# Patient Record
Sex: Male | Born: 1980 | Race: Black or African American | Hispanic: No | Marital: Single | Smoking: Current every day smoker
Health system: Southern US, Community
[De-identification: ages and names within clinical notes are randomized; demographics above are authoritative.]

---

## 2018-05-23 ENCOUNTER — Encounter (HOSPITAL_COMMUNITY): Payer: Self-pay

## 2018-05-23 ENCOUNTER — Emergency Department (HOSPITAL_COMMUNITY)
Admission: EM | Admit: 2018-05-23 | Discharge: 2018-05-23 | Disposition: A | Discharge status: Home / Self Care | Payer: Self-pay | Attending: Emergency Medicine | Admitting: Emergency Medicine

## 2018-05-23 ENCOUNTER — Other Ambulatory Visit: Payer: Self-pay

## 2018-05-23 DIAGNOSIS — R202 Paresthesia of skin: Secondary | ICD-10-CM | POA: Insufficient documentation

## 2018-05-23 DIAGNOSIS — M5410 Radiculopathy, site unspecified: Secondary | ICD-10-CM | POA: Insufficient documentation

## 2018-05-23 DIAGNOSIS — F17210 Nicotine dependence, cigarettes, uncomplicated: Secondary | ICD-10-CM | POA: Insufficient documentation

## 2018-05-23 MED ORDER — PREDNISONE 10 MG (21) PO TBPK: ORAL_TABLET | Freq: Every day | ORAL | 0 refills | Status: DC

## 2018-05-23 MED ORDER — METHOCARBAMOL 500 MG PO TABS: 500.0000 mg | ORAL_TABLET | Freq: Two times a day (BID) | ORAL | 0 refills | Status: DC

## 2018-05-23 MED ORDER — OXYCODONE-ACETAMINOPHEN 5-325 MG PO TABS
1.0000 | ORAL_TABLET | Freq: Once | ORAL | Status: AC
  Administered 2018-05-23: 1 via ORAL
  Filled 2018-05-23: qty 1

## 2018-05-23 NOTE — ED Provider Notes (Signed)
[facility]-EMERGENCY DEPT Provider Note   CSN: [id number] Arrival date & time: 05/23/18  1106     History   Chief Complaint Chief Complaint  Patient presents with  . Arm Pain    HPI Wayne Figueroa is a 37 y.o. male presents to ED for evaluation of 38-month history of right upper extremity sharp shooting pain that originates in wrist and radiates up his arm.  Reports associated paresthesias.  States that symptoms initially began 6 months ago when he was lifting heavy boxes about 75 200 pounds each for work.  He has since quit that job.  He has been taking NSAIDs with no improvement in his symptoms.  He denies any direct injuries or falls.  No prior fracture, dislocations or procedures in the area.  States that pain is worse with movement.  Denies history of gout, joint pains, fever, numbness in arms or legs, outside work in the heat, history of DVT, recent surgeries, recent prolonged travel or chest pain.  HPI  History reviewed. No pertinent past medical history.  There are no active problems to display for this patient.   History reviewed. No pertinent surgical history.      Home Medications    Prior to Admission medications   Medication Sig Start Date End Date Taking? Authorizing Provider  methocarbamol (ROBAXIN) 500 MG tablet Take 1 tablet (500 mg total) by mouth 2 (two) times daily. 05/23/18   Hyman Crossan, PA-C  predniSONE (STERAPRED UNI-PAK 21 TAB) 10 MG (21) TBPK tablet Take by mouth daily. Take 6 tabs by mouth daily  for 2 days, then 5 tabs for 2 days, then 4 tabs for 2 days, then 3 tabs for 2 days, 2 tabs for 2 days, then 1 tab by mouth daily for 2 days 05/23/18   Dietrich Pates, PA-C    Family History No family history on file.  Social History Social History   Tobacco Use  . Smoking status: Current Every Day Smoker    Packs/day: 0.50    Types: Cigarettes  . Smokeless tobacco: Never Used  Substance Use Topics  . Alcohol use: Yes   Comment: socially  . Drug use: Never     Allergies   Patient has no known allergies.   Review of Systems Review of Systems  Constitutional: Negative for chills and fever.  Cardiovascular: Negative for chest pain.  Musculoskeletal: Positive for myalgias.  Skin: Negative for wound.  Neurological: Negative for dizziness and numbness.     Physical Exam Updated Vital Signs BP (!) 148/84 (BP Location: Left Arm)   Pulse 74   Temp 97.9 F (36.6 C) (Oral)   Resp 16   Ht 6\' 2"  (1.88 m)   Wt 73 kg   SpO2 100%   BMI 20.67 kg/m   Physical Exam  Constitutional: He appears well-developed and well-nourished. No distress.  HENT:  Head: Normocephalic and atraumatic.  Eyes: Conjunctivae and EOM are normal. No scleral icterus.  Neck: Normal range of motion.  Cardiovascular: Normal rate, regular rhythm and normal heart sounds.  Pulmonary/Chest: Effort normal and breath sounds normal. No respiratory distress.  Musculoskeletal: Normal range of motion. He exhibits tenderness. He exhibits no edema or deformity.  Positive Phalen's test.  No joint swelling noted of bilateral upper extremities.  Full active and passive range of motion of wrist, elbow and shoulder of the right upper extremity.  2+ radial pulse palpated bilaterally.  No palpable cords, edema of arm noted.  No warmth of joint  noted.  Neurological: He is alert.  Skin: No rash noted. He is not diaphoretic.  Psychiatric: He has a normal mood and affect.  Nursing note and vitals reviewed.    ED Treatments / Results  Labs (all labs ordered are listed, but only abnormal results are displayed) Labs Reviewed - No data to display  EKG None  Radiology No results found.  Procedures Procedures (including critical care time)  Medications Ordered in ED Medications  oxyCODONE-acetaminophen (PERCOCET/ROXICET) 5-325 MG per tablet 1 tablet (has no administration in time range)     Initial Impression / Assessment and Plan / ED  Course  I have reviewed the triage vital signs and the nursing notes.  Pertinent labs & imaging results that were available during my care of the patient were reviewed by me and considered in my medical decision making (see chart for details).     37 year old male presents to ED for evaluation of 1457-month history of gradually worsening right arm pain.  Describes the pain as sharp, shooting and associated with paresthesias.  Pain began after he was lifting heavy boxes for work.  He has since quit this job.  No history of DVT.  On exam there is evidence of radiculopathy noted.  No objective findings that would concern me for DVT or infectious cause of symptoms.  Full active and passive range of motion of extremity without difficulty.  Area is neurovascularly intact.  No imaging warranted at this time as he denies any injury.  Will treat for possible radiculopathy.  Will give muscle relaxer, steroids, orthopedic follow-up.  Will advise him to return to ED for any severe worsening symptoms.  Portions of this note were generated with Scientist, clinical (histocompatibility and immunogenetics)Dragon dictation software. Dictation errors may occur despite best attempts at proofreading.   Final Clinical Impressions(s) / ED Diagnoses   Final diagnoses:  Radiculopathy, unspecified spinal region    ED Discharge Orders         Ordered    predniSONE (STERAPRED UNI-PAK 21 TAB) 10 MG (21) TBPK tablet  Daily     05/23/18 1248    methocarbamol (ROBAXIN) 500 MG tablet  2 times daily     05/23/18 1248           Dietrich PatesKhatri, Jhan Conery, PA-C 05/23/18 1252    Little, Ambrose Finlandachel Morgan, MD 05/23/18 [phone]

## 2018-05-23 NOTE — ED Triage Notes (Signed)
Pt states that he has been having right arm pain for 7-8 months. Pt states pain initially started when he was moving heavy boxes. Pt states that he came in today because the pain has continued to get worse.

## 2018-05-23 NOTE — Discharge Instructions (Signed)
Return to ED for worsening symptoms, loss of sensation in your arms or legs, injuries or falls, swelling of your joints, chest pain.

## 2018-05-23 NOTE — ED Notes (Signed)
Patient out of ED with steady gait in no distress.

## 2018-05-30 ENCOUNTER — Emergency Department (HOSPITAL_COMMUNITY)
Admission: EM | Admit: 2018-05-30 | Discharge: 2018-05-30 | Disposition: A | Discharge status: Home / Self Care | Payer: Self-pay | Attending: Emergency Medicine | Admitting: Emergency Medicine

## 2018-05-30 ENCOUNTER — Encounter (HOSPITAL_COMMUNITY): Payer: Self-pay | Admitting: Emergency Medicine

## 2018-05-30 DIAGNOSIS — R51 Headache: Secondary | ICD-10-CM | POA: Insufficient documentation

## 2018-05-30 DIAGNOSIS — K0889 Other specified disorders of teeth and supporting structures: Secondary | ICD-10-CM | POA: Insufficient documentation

## 2018-05-30 DIAGNOSIS — F17210 Nicotine dependence, cigarettes, uncomplicated: Secondary | ICD-10-CM | POA: Insufficient documentation

## 2018-05-30 MED ORDER — IBUPROFEN 200 MG PO TABS
600.0000 mg | ORAL_TABLET | Freq: Once | ORAL | Status: AC
  Administered 2018-05-30: 600 mg via ORAL
  Filled 2018-05-30: qty 3

## 2018-05-30 MED ORDER — PENICILLIN V POTASSIUM 500 MG PO TABS: 500.0000 mg | ORAL_TABLET | Freq: Four times a day (QID) | ORAL | 0 refills | Status: AC

## 2018-05-30 MED ORDER — PENICILLIN V POTASSIUM 500 MG PO TABS
500.0000 mg | ORAL_TABLET | Freq: Once | ORAL | Status: AC
  Administered 2018-05-30: 500 mg via ORAL
  Filled 2018-05-30: qty 1

## 2018-05-30 NOTE — Discharge Instructions (Signed)
Please take all of your antibiotics until finished!   You may develop abdominal discomfort or diarrhea from the antibiotic.  You may help offset this with probiotics which you can buy or get in yogurt. Do not eat  or take the probiotics until 2 hours after your antibiotic.   Apply warm compresses to jaw throughout the day. Alternate 600 mg of ibuprofen and [phone] mg of Tylenol every 3 hours as needed for pain. Do not exceed 4000 mg of Tylenol daily.  You may also use warm water salt gargles, Orajel, or other over-the-counter dental pain remedies. Followup with a dentist is very important for ongoing evaluation and management of recurrent dental pain.  I have attached resources for follow-up with a dentist in the area.  You can also wait until your dental insurance kicks in and you can call the number on the back of your insurance card to help find a dentist.  Return to emergency department for emergent changing or worsening symptoms such as fever, worsening facial swelling, difficulty breathing or swallowing, throat tightness, or vision changes.

## 2018-05-30 NOTE — ED Triage Notes (Signed)
Patient here from home with complaints of right lower abscess in mouth. Dental pain x2 weeks. OTC meds with no relief.

## 2018-05-30 NOTE — ED Provider Notes (Signed)
[facility]-EMERGENCY DEPT Provider Note   CSN: [id number] Arrival date & time: 05/30/18  4098     History   Chief Complaint Chief Complaint  Patient presents with  . Dental Pain  . Abscess    HPI Wayne Figueroa is a 37 y.o. male presents today for evaluation of acute onset, progressively worsening right mandibular dental pain for 2 weeks.  He has noted a dull throbbing to this area intermittently for the past 2 weeks which acutely worsened 2 days ago and has been constant.  He notes radiation to the right side of the face and skull.  Notes headaches are mild and throbbing, similar to headaches he has had in the past.  Denies difficulty breathing or swallowing, no fevers.  Has tried warm tea without relief of his symptoms. Mild facial swelling began today, no drainage. Recently relocated to this area and does not have a dentist.   The history is provided by the patient.    History reviewed. No pertinent past medical history.  There are no active problems to display for this patient.   History reviewed. No pertinent surgical history.      Home Medications    Prior to Admission medications   Medication Sig Start Date End Date Taking? Authorizing Provider  ibuprofen (ADVIL,MOTRIN) 200 MG tablet Take 400 mg by mouth every 6 (six) hours as needed for mild pain.   Yes [provider]  methocarbamol (ROBAXIN) 500 MG tablet Take 1 tablet (500 mg total) by mouth 2 (two) times daily. Patient not taking: Reported on 05/30/2018 05/23/18   Dietrich Pates, PA-C  penicillin v potassium (VEETID) 500 MG tablet Take 1 tablet (500 mg total) by mouth 4 (four) times daily for 7 days. 05/30/18 06/06/18  Michela Pitcher A, PA-C  predniSONE (STERAPRED UNI-PAK 21 TAB) 10 MG (21) TBPK tablet Take by mouth daily. Take 6 tabs by mouth daily  for 2 days, then 5 tabs for 2 days, then 4 tabs for 2 days, then 3 tabs for 2 days, 2 tabs for 2 days, then 1 tab by mouth daily for 2  days Patient not taking: Reported on 05/30/2018 05/23/18   Dietrich Pates, PA-C    Family History No family history on file.  Social History Social History   Tobacco Use  . Smoking status: Current Every Day Smoker    Packs/day: 0.50    Types: Cigarettes  . Smokeless tobacco: Never Used  Substance Use Topics  . Alcohol use: Yes    Comment: socially  . Drug use: Never     Allergies   Patient has no known allergies.   Review of Systems Review of Systems  Constitutional: Negative for chills and fever.  HENT: Positive for dental problem and facial swelling. Negative for trouble swallowing.   Eyes: Negative for photophobia and visual disturbance.  Respiratory: Negative for shortness of breath.   Gastrointestinal: Negative for nausea and vomiting.  Neurological: Positive for headaches. Negative for dizziness, syncope, speech difficulty, weakness, light-headedness and numbness.  All other systems reviewed and are negative.    Physical Exam Updated Vital Signs BP 136/86 (BP Location: Right Arm)   Pulse 72   Temp 98.6 F (37 C) (Oral)   Resp 20   SpO2 100%   Physical Exam  Constitutional: He is oriented to person, place, and time. He appears well-developed and well-nourished. No distress.  HENT:  Head: Normocephalic and atraumatic.  Generally decaying dentition with right second premolar with large cavity  and exposed dentin.  No tenderness to percussion.  Very mild area of swelling to the right mandibular region with no fluctuance or drainage. Dentition appears to be stable. No trismus. Mouth opening to at least 3 finger widths. Handles oral secretions without difficulty. No noted facial swelling otherwise. No swelling or tenderness to the submental or submandibular regions. No swelling or tenderness into the soft tissues of the neck.   Eyes: Pupils are equal, round, and reactive to light. Conjunctivae and EOM are normal. Right eye exhibits no discharge. Left eye exhibits  no discharge.  Neck: Normal range of motion. Neck supple. No JVD present. No tracheal deviation present.  Cardiovascular: Normal rate.  Pulmonary/Chest: Effort normal.  Abdominal: He exhibits no distension.  Musculoskeletal: He exhibits no edema.  Neurological: He is alert and oriented to person, place, and time. No cranial nerve deficit or sensory deficit. He exhibits normal muscle tone.  Skin: Skin is warm and dry. No erythema.  Psychiatric: He has a normal mood and affect. His behavior is normal.  Nursing note and vitals reviewed.    ED Treatments / Results  Labs (all labs ordered are listed, but only abnormal results are displayed) Labs Reviewed - No data to display  EKG None  Radiology No results found.  Procedures Procedures (including critical care time)  Medications Ordered in ED Medications  penicillin v potassium (VEETID) tablet 500 mg (has no administration in time range)  ibuprofen (ADVIL,MOTRIN) tablet 600 mg (has no administration in time range)     Initial Impression / Assessment and Plan / ED Course  I have reviewed the triage vital signs and the nursing notes.  Pertinent labs & imaging results that were available during my care of the patient were reviewed by me and considered in my medical decision making (see chart for details).     Patient with toothache.  No gross abscess amenable to I&D.  Patient is afebrile, vital signs are stable.  He is nontoxic in appearance.  Tolerating secretions without difficulty.  No focal neurologic deficits.  History of head trauma.  Exam unconcerning for Ludwig's angina or spread of infection.  Will treat with penicillin and NSAIDs.  Urged patient to follow-up with dentist, and he states he will be obtaining dental insurance shortly.  Discussed strict ED return precautions. Pt verbalized understanding of and agreement with plan and is safe for discharge home at this time.    Final Clinical Impressions(s) / ED Diagnoses    Final diagnoses:  Pain, dental    ED Discharge Orders         Ordered    penicillin v potassium (VEETID) 500 MG tablet  4 times daily     05/30/18 1228           Jeanie Sewer, PA-C 05/30/18 1230    Alvira Monday, MD 05/30/18 2322

## 2018-09-18 ENCOUNTER — Encounter (HOSPITAL_COMMUNITY): Payer: Self-pay | Admitting: *Deleted

## 2018-09-18 ENCOUNTER — Other Ambulatory Visit: Payer: Self-pay

## 2018-09-18 DIAGNOSIS — K047 Periapical abscess without sinus: Secondary | ICD-10-CM | POA: Insufficient documentation

## 2018-09-18 DIAGNOSIS — K029 Dental caries, unspecified: Secondary | ICD-10-CM | POA: Insufficient documentation

## 2018-09-18 DIAGNOSIS — F17210 Nicotine dependence, cigarettes, uncomplicated: Secondary | ICD-10-CM | POA: Insufficient documentation

## 2018-09-18 NOTE — ED Triage Notes (Signed)
Pt presents with a "knot" on the right side of pt's jaw.  Pt stated that a few month ago, he had an abscess in that area on his gum. Pt reports he's had the knot for the past month. Pt denies any fevers or chills.

## 2018-09-19 ENCOUNTER — Emergency Department (HOSPITAL_COMMUNITY)
Admission: EM | Admit: 2018-09-19 | Discharge: 2018-09-19 | Disposition: A | Discharge status: Home / Self Care | Payer: Self-pay | Attending: Emergency Medicine | Admitting: Emergency Medicine

## 2018-09-19 DIAGNOSIS — K029 Dental caries, unspecified: Secondary | ICD-10-CM

## 2018-09-19 DIAGNOSIS — K047 Periapical abscess without sinus: Secondary | ICD-10-CM

## 2018-09-19 MED ORDER — IBUPROFEN 800 MG PO TABS
800.0000 mg | ORAL_TABLET | Freq: Once | ORAL | Status: AC
  Administered 2018-09-19: 800 mg via ORAL
  Filled 2018-09-19: qty 1

## 2018-09-19 MED ORDER — CLINDAMYCIN HCL 150 MG PO CAPS: 300.0000 mg | ORAL_CAPSULE | Freq: Four times a day (QID) | ORAL | 0 refills | Status: AC

## 2018-09-19 MED ORDER — CLINDAMYCIN HCL 300 MG PO CAPS
300.0000 mg | ORAL_CAPSULE | Freq: Once | ORAL | Status: AC
  Administered 2018-09-19: 300 mg via ORAL
  Filled 2018-09-19: qty 1

## 2018-09-19 MED ORDER — ACETAMINOPHEN 500 MG PO TABS
1000.0000 mg | ORAL_TABLET | Freq: Once | ORAL | Status: AC
  Administered 2018-09-19: 1000 mg via ORAL
  Filled 2018-09-19: qty 2

## 2018-09-19 NOTE — Discharge Instructions (Addendum)
Use ice pack on your face over the swollen area.  Take ibuprofen 600 mg plus acetaminophen 1000 mg 4 times a day for pain.  I gave you a prescription card for good Rx for your antibiotic, [facility] it to [facility] and it should be around $12.  Please try to find a dentist to do definitive treatment for your teeth, if you want the teeth pulled sometimes the denture services offices will pull it for a cheaper rate than a dentist.  Return to the emergency department if he having difficulty swallowing or breathing or the swelling spreads to the upper portion of your face.

## 2018-09-19 NOTE — ED Provider Notes (Signed)
[facility]-EMERGENCY DEPT Provider Note   CSN: [id number] Arrival date & time: 09/18/18  2143  Time seen 2:45 AM   History   Chief Complaint Chief Complaint  Patient presents with  . Abscess    right jaw    HPI Wayne Figueroa is a 38 y.o. male.  HPI patient states he was seen about 3 months ago for a dental abscess and was treated with penicillin.  When I review his prior ED visits in our system it was in September 2019.  He states for the past month he has been getting increasing swelling.  He denies any fever.  He states when he sleeps it hurts on the right side of his face.  He is only noticed sugar content of food makes the pain worse, nothing makes it feel better, he denies any hot or cold sensitivity.  He denies having a dentist.  PCP Patient, No Pcp Per   History reviewed. No pertinent past medical history.  There are no active problems to display for this patient.   History reviewed. No pertinent surgical history.      Home Medications    None  Prior to Admission medications   Medication Sig Start Date End Date Taking? Authorizing Provider  clindamycin (CLEOCIN) 150 MG capsule Take 2 capsules (300 mg total) by mouth 4 (four) times daily for 10 days. 09/19/18 09/29/18  Devoria Albe, MD  methocarbamol (ROBAXIN) 500 MG tablet Take 1 tablet (500 mg total) by mouth 2 (two) times daily. Patient not taking: Reported on 05/30/2018 05/23/18   Dietrich Pates, PA-C  predniSONE (STERAPRED UNI-PAK 21 TAB) 10 MG (21) TBPK tablet Take by mouth daily. Take 6 tabs by mouth daily  for 2 days, then 5 tabs for 2 days, then 4 tabs for 2 days, then 3 tabs for 2 days, 2 tabs for 2 days, then 1 tab by mouth daily for 2 days Patient not taking: Reported on 05/30/2018 05/23/18   Dietrich Pates, PA-C    Family History No family history on file.  Social History Social History   Tobacco Use  . Smoking status: Current Every Day Smoker    Packs/day: 0.33    Types:  Cigarettes  . Smokeless tobacco: Never Used  Substance Use Topics  . Alcohol use: Yes    Comment: socially  . Drug use: Never     Allergies   Patient has no known allergies.   Review of Systems Review of Systems  All other systems reviewed and are negative.    Physical Exam Updated Vital Signs BP (!) 153/87 (BP Location: Left Arm)   Pulse 95   Temp 98.9 F (37.2 C) (Oral)   Resp 17   Ht 6\' 2"  (1.88 m)   Wt 76.7 kg   SpO2 100%   BMI 21.70 kg/m   Physical Exam Vitals signs and nursing note reviewed.  Constitutional:      Appearance: Normal appearance. He is normal weight.  HENT:     Head: Normocephalic.     Right Ear: External ear normal.     Left Ear: External ear normal.     Nose: Nose normal.     Mouth/Throat:     Mouth: Mucous membranes are moist.      Comments: Patient has swelling of the gums around the 3 affected teeth with no pointing or obvious abscess to lance.  When I look at his face he does have some localized swelling in the same area. Eyes:  Extraocular Movements: Extraocular movements intact.     Conjunctiva/sclera: Conjunctivae normal.     Pupils: Pupils are equal, round, and reactive to light.  Neck:     Musculoskeletal: Normal range of motion.  Cardiovascular:     Rate and Rhythm: Normal rate.  Pulmonary:     Effort: Pulmonary effort is normal. No respiratory distress.  Musculoskeletal: Normal range of motion.  Skin:    General: Skin is warm and dry.     Findings: No erythema.  Neurological:     General: No focal deficit present.     Mental Status: He is alert and oriented to person, place, and time.     Cranial Nerves: No cranial nerve deficit.  Psychiatric:        Mood and Affect: Mood normal.        Behavior: Behavior normal.        Thought Content: Thought content normal.      ED Treatments / Results  Labs (all labs ordered are listed, but only abnormal results are displayed)   EKG None  Radiology No results  found.  Procedures Procedures (including critical care time)  Medications Ordered in ED Medications  clindamycin (CLEOCIN) capsule 300 mg (has no administration in time range)  ibuprofen (ADVIL,MOTRIN) tablet 800 mg (has no administration in time range)  acetaminophen (TYLENOL) tablet 1,000 mg (has no administration in time range)     Initial Impression / Assessment and Plan / ED Course  I have reviewed the triage vital signs and the nursing notes.  Pertinent labs & imaging results that were available during my care of the patient were reviewed by me and considered in my medical decision making (see chart for details).    Patient was started on acetaminophen and ibuprofen for pain.  He was started on clindamycin.  I found him a good Rx discount card to get the antibiotics for $12.   Final Clinical Impressions(s) / ED Diagnoses   Final diagnoses:  Dental abscess  Dental caries    ED Discharge Orders         Ordered    clindamycin (CLEOCIN) 150 MG capsule  4 times daily     09/19/18 0311        OTC ibuprofen and acetaminophen  Plan discharge  Devoria Albe, MD, Concha Pyo, MD 09/19/18 [phone]

## 2018-11-05 ENCOUNTER — Emergency Department (HOSPITAL_COMMUNITY)
Admission: EM | Admit: 2018-11-05 | Discharge: 2018-11-05 | Disposition: A | Discharge status: Home / Self Care | Payer: Self-pay | Attending: Emergency Medicine | Admitting: Emergency Medicine

## 2018-11-05 ENCOUNTER — Encounter (HOSPITAL_COMMUNITY): Payer: Self-pay | Admitting: *Deleted

## 2018-11-05 DIAGNOSIS — F17210 Nicotine dependence, cigarettes, uncomplicated: Secondary | ICD-10-CM | POA: Insufficient documentation

## 2018-11-05 DIAGNOSIS — L0201 Cutaneous abscess of face: Secondary | ICD-10-CM | POA: Insufficient documentation

## 2018-11-05 DIAGNOSIS — Z79899 Other long term (current) drug therapy: Secondary | ICD-10-CM | POA: Insufficient documentation

## 2018-11-05 DIAGNOSIS — L0291 Cutaneous abscess, unspecified: Secondary | ICD-10-CM

## 2018-11-05 MED ORDER — LIDOCAINE HCL 2 % IJ SOLN
20.0000 mL | Freq: Once | INTRAMUSCULAR | Status: AC
  Administered 2018-11-05: 400 mg
  Filled 2018-11-05: qty 20

## 2018-11-05 MED ORDER — OXYCODONE-ACETAMINOPHEN 5-325 MG PO TABS: 1.0000 | ORAL_TABLET | Freq: Four times a day (QID) | ORAL | 0 refills | Status: DC | PRN

## 2018-11-05 MED ORDER — CLINDAMYCIN HCL 300 MG PO CAPS: 300.0000 mg | ORAL_CAPSULE | Freq: Four times a day (QID) | ORAL | 0 refills | Status: AC

## 2018-11-05 NOTE — ED Triage Notes (Signed)
Pt in c/o dental pain with possible abscess, states this has happened in the same area several times in the last few months and he keeps coming in and getting antibiotics that will resolve the abscess but it returns

## 2018-11-05 NOTE — Discharge Instructions (Addendum)
Please read the attached information please return immediately if develop any new or worsening signs or symptoms.  Please follow-up as indicated

## 2018-11-05 NOTE — ED Provider Notes (Signed)
[facility] EMERGENCY DEPARTMENT Provider Note   CSN: [id number] Arrival date & time: 11/05/18  8242    History   Chief Complaint Chief Complaint  Patient presents with  . Dental Pain    HPI Wayne Figueroa is a 38 y.o. male.     HPI   38 year old male presents today with complaints of facial swelling.  Patient notes he developed an abscess to the right side of his face approximately 3 months ago.  He was placed on Bactrim which did not improve his symptoms.  He notes he followed up again on 09/19/2018.  He was at that time placed on clindamycin.  He notes he had improvement in symptoms.  Approximately 1 week after stopping the clindamycin he had return of swelling to the right cheek.  Patient notes this is uncomfortable but is not severely painful.  He notes he has poor dentition but denies any specific dental pain.  Patient denies any fevers.  He any chronic dental issues.   History reviewed. No pertinent past medical history.  There are no active problems to display for this patient.   History reviewed. No pertinent surgical history.      Home Medications    Prior to Admission medications   Medication Sig Start Date End Date Taking? Authorizing Provider  clindamycin (CLEOCIN) 300 MG capsule Take 1 capsule (300 mg total) by mouth every 6 (six) hours for 10 days. 11/05/18 11/15/18  Anan Dapolito, Tinnie Gens, PA-C  methocarbamol (ROBAXIN) 500 MG tablet Take 1 tablet (500 mg total) by mouth 2 (two) times daily. Patient not taking: Reported on 05/30/2018 05/23/18   Dietrich Pates, PA-C  oxyCODONE-acetaminophen (PERCOCET/ROXICET) 5-325 MG tablet Take 1 tablet by mouth every 6 (six) hours as needed for severe pain. 11/05/18   Surafel Hilleary, Tinnie Gens, PA-C  predniSONE (STERAPRED UNI-PAK 21 TAB) 10 MG (21) TBPK tablet Take by mouth daily. Take 6 tabs by mouth daily  for 2 days, then 5 tabs for 2 days, then 4 tabs for 2 days, then 3 tabs for 2 days, 2 tabs for 2 days, then 1 tab by  mouth daily for 2 days Patient not taking: Reported on 05/30/2018 05/23/18   Dietrich Pates, PA-C    Family History History reviewed. No pertinent family history.  Social History Social History   Tobacco Use  . Smoking status: Current Every Day Smoker    Packs/day: 0.33    Types: Cigarettes  . Smokeless tobacco: Never Used  Substance Use Topics  . Alcohol use: Yes    Comment: socially  . Drug use: Never     Allergies   Patient has no known allergies.   Review of Systems Review of Systems  All other systems reviewed and are negative.  Physical Exam Updated Vital Signs BP 134/84 (BP Location: Right Arm)   Pulse 85   Temp 99.2 F (37.3 C) (Oral)   Resp 18   SpO2 99%   Physical Exam Vitals signs and nursing note reviewed.  Constitutional:      Appearance: He is well-developed.  HENT:     Head: Normocephalic and atraumatic.     Mouth/Throat:     Comments: Right facial swelling along the mandible-intraoral exam shows numerous dental caries, no induration tenderness along the gumline-right soft tissue cheek with induration approximately 2 and half centimeters with central fluctuance Eyes:     General: No scleral icterus.       Right eye: No discharge.        Left  eye: No discharge.     Conjunctiva/sclera: Conjunctivae normal.     Pupils: Pupils are equal, round, and reactive to light.  Neck:     Musculoskeletal: Normal range of motion.     Vascular: No JVD.     Trachea: No tracheal deviation.  Pulmonary:     Effort: Pulmonary effort is normal.     Breath sounds: No stridor.  Neurological:     Mental Status: He is alert and oriented to person, place, and time.     Coordination: Coordination normal.  Psychiatric:        Behavior: Behavior normal.        Thought Content: Thought content normal.        Judgment: Judgment normal.      ED Treatments / Results  Labs (all labs ordered are listed, but only abnormal results are displayed) Labs Reviewed    AEROBIC/ANAEROBIC CULTURE (SURGICAL/DEEP WOUND)    EKG None  Radiology No results found.  Procedures Procedures (including critical care time)  Medications Ordered in ED Medications  lidocaine (XYLOCAINE) 2 % (with pres) injection 400 mg (400 mg Infiltration Given 11/05/18 0932)     Assessment/Plan: 38 year old male presents today with facial abscess.  He continues to have recurrence of symptoms in the same location..  I was able to aspirate a small amount of purulence.  I do have concern that this will continue to recur without surgical intervention.  Given the location of the infection he will refer to plastic surgery.  He has no dental etiology at this time, other than poor dentition.  Patient has had success with clindamycin as opposed to Bactrim which was unsuccessful.  I will place him back on clindamycin.  Patient encouraged to return immediately if develops any new or worsening signs or symptoms.  He verbalized understanding and agreement to today's plan had no further questions or concerns at the time discharge.      Final Clinical Impressions(s) / ED Diagnoses   Final diagnoses:  Abscess    ED Discharge Orders         Ordered    clindamycin (CLEOCIN) 300 MG capsule  Every 6 hours     11/05/18 1030    oxyCODONE-acetaminophen (PERCOCET/ROXICET) 5-325 MG tablet  Every 6 hours PRN     11/05/18 1047           Eyvonne Mechanic, PA-C 11/05/18 1312    Cathren Laine, MD 11/06/18 1626

## 2018-11-11 LAB — AEROBIC/ANAEROBIC CULTURE W GRAM STAIN (SURGICAL/DEEP WOUND): Special Requests: NORMAL

## 2018-11-11 LAB — AEROBIC/ANAEROBIC CULTURE (SURGICAL/DEEP WOUND)

## 2018-11-12 NOTE — Progress Notes (Signed)
ED Antimicrobial Stewardship Positive Culture Follow Up   Wayne Figueroa is an 38 y.o. male who presented to [facility] on 11/05/2018 with a chief complaint of  Chief Complaint  Patient presents with  . Dental Pain    Recent Results (from the past 720 hour(s))  Aerobic/Anaerobic Culture (surgical/deep wound)     Status: None   Collection Time: 11/05/18 10:40 AM  Result Value Ref Range Status   Specimen Description FACE  Final   Special Requests Normal  Final   Gram Stain   Final    MODERATE WBC PRESENT, PREDOMINANTLY PMN NO ORGANISMS SEEN    Culture   Final    FEW ACTINOMYCES MEYERI FEW PARVIMONAS MICRA FEW PREVOTELLA ORIS BETA LACTAMASE POSITIVE Performed at [facility], [address], [place], [place] [address]    Report Status 11/11/2018 FINAL  Final    Discussed case with Infectious Disease team.  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  Expect long length of therapy.  ID team will arrange follow up in their clinic.   Wayne Figueroa 11/12/2018, 3:12 PM Clinical Pharmacist Monday - Friday phone -  [phone] Saturday - Sunday phone - [phone]

## 2018-11-19 ENCOUNTER — Ambulatory Visit: Payer: Self-pay | Admitting: Infectious Diseases

## 2018-12-30 ENCOUNTER — Encounter (HOSPITAL_COMMUNITY): Payer: Self-pay | Admitting: *Deleted

## 2018-12-30 ENCOUNTER — Other Ambulatory Visit: Payer: Self-pay

## 2018-12-30 ENCOUNTER — Emergency Department (HOSPITAL_COMMUNITY)
Admission: EM | Admit: 2018-12-30 | Discharge: 2018-12-30 | Disposition: A | Discharge status: Home / Self Care | Payer: Self-pay | Attending: Emergency Medicine | Admitting: Emergency Medicine

## 2018-12-30 DIAGNOSIS — L0201 Cutaneous abscess of face: Secondary | ICD-10-CM | POA: Insufficient documentation

## 2018-12-30 DIAGNOSIS — F17210 Nicotine dependence, cigarettes, uncomplicated: Secondary | ICD-10-CM | POA: Insufficient documentation

## 2018-12-30 DIAGNOSIS — L0291 Cutaneous abscess, unspecified: Secondary | ICD-10-CM

## 2018-12-30 MED ORDER — CLINDAMYCIN HCL 300 MG PO CAPS: 300.0000 mg | ORAL_CAPSULE | Freq: Four times a day (QID) | ORAL | 0 refills | Status: DC

## 2018-12-30 NOTE — Discharge Instructions (Addendum)
Please return for any problem.  Follow-up with your regular care provider as instructed. °

## 2018-12-30 NOTE — ED Notes (Signed)
ED Provider at bedside. 

## 2018-12-30 NOTE — ED Triage Notes (Signed)
PT reports Rt facial abscess . Swelling reduced while taking meds but swelling has now returned.

## 2018-12-30 NOTE — ED Provider Notes (Signed)
[facility] Provider Note   CSN: [id number] Arrival date & time: 12/30/18  1049    History   Chief Complaint Chief Complaint  Patient presents with  . Recurrent Skin Infections    HPI Wayne Figueroa is a 38 y.o. male.      38 year old male with prior medical history as detailed below presents for evaluation of persistent abscess over the right cheek.  Patient with prior occurrences of this same inflammation in the same area.  Patient reports that previously he has headache I&D.  He was on clindamycin approximately month ago with resolution of symptoms.  Patient reports that he has been told cyst in the area that is persistently becoming infected.  He reports increased inflammation and drainage from the area over the last 3 days.  He denies fever.  He denies other complaint such as cough, congestion, shortness of breath, nausea, vomiting, chest pain, or other acute complaint.  He declines repeat I&D at this time.  He is able to express purulence and blood from the lesion.   The history is provided by the patient and medical records.  Abscess  Location:  Face Facial abscess location:  Face Size:  1cm Abscess quality: draining and redness   Red streaking: no   Duration:  3 days Progression:  Unchanged Chronicity:  New Relieved by:  Nothing Worsened by:  Nothing Ineffective treatments:  None tried   History reviewed. No pertinent past medical history.  There are no active problems to display for this patient.   History reviewed. No pertinent surgical history.      Home Medications    Prior to Admission medications   Medication Sig Start Date End Date Taking? Authorizing Provider  clindamycin (CLEOCIN) 300 MG capsule Take 1 capsule (300 mg total) by mouth every 6 (six) hours. 12/30/18   Wynetta FinesMessick, Peter C, MD  methocarbamol (ROBAXIN) 500 MG tablet Take 1 tablet (500 mg total) by mouth 2 (two) times daily. Patient not taking:  Reported on 05/30/2018 05/23/18   Dietrich PatesKhatri, Hina, PA-C  oxyCODONE-acetaminophen (PERCOCET/ROXICET) 5-325 MG tablet Take 1 tablet by mouth every 6 (six) hours as needed for severe pain. 11/05/18   Hedges, Tinnie GensJeffrey, PA-C  predniSONE (STERAPRED UNI-PAK 21 TAB) 10 MG (21) TBPK tablet Take by mouth daily. Take 6 tabs by mouth daily  for 2 days, then 5 tabs for 2 days, then 4 tabs for 2 days, then 3 tabs for 2 days, 2 tabs for 2 days, then 1 tab by mouth daily for 2 days Patient not taking: Reported on 05/30/2018 05/23/18   Dietrich PatesKhatri, Hina, PA-C    Family History History reviewed. No pertinent family history.  Social History Social History   Tobacco Use  . Smoking status: Current Every Day Smoker    Packs/day: 0.33    Types: Cigarettes  . Smokeless tobacco: Never Used  Substance Use Topics  . Alcohol use: Yes    Comment: socially  . Drug use: Never     Allergies   Patient has no known allergies.   Review of Systems Review of Systems  All other systems reviewed and are negative.    Physical Exam Updated Vital Signs BP 134/90 (BP Location: Right Arm)   Pulse 64   Temp 98.5 F (36.9 C) (Oral)   Resp 12   Ht 6\' 2"  (1.88 m)   Wt 80.3 kg   SpO2 100%   BMI 22.73 kg/m   Physical Exam Vitals signs and nursing note  reviewed.  Constitutional:      General: He is not in acute distress.    Appearance: Normal appearance. He is well-developed.  HENT:     Head: Normocephalic and atraumatic.  Eyes:     Conjunctiva/sclera: Conjunctivae normal.     Pupils: Pupils are equal, round, and reactive to light.  Neck:     Musculoskeletal: Normal range of motion and neck supple.  Cardiovascular:     Rate and Rhythm: Normal rate and regular rhythm.     Heart sounds: Normal heart sounds.  Pulmonary:     Effort: Pulmonary effort is normal. No respiratory distress.     Breath sounds: Normal breath sounds.  Abdominal:     General: There is no distension.     Palpations: Abdomen is soft.      Tenderness: There is no abdominal tenderness.  Musculoskeletal: Normal range of motion.        General: No deformity.  Skin:    General: Skin is warm and dry.     Comments: Small abscess on right cheek - just lateral to the mouth - easily expressible purulence with gentle pressure application - no surrounding erythema or signs of cellulitis   Neurological:     Mental Status: He is alert and oriented to person, place, and time.      ED Treatments / Results  Labs (all labs ordered are listed, but only abnormal results are displayed) Labs Reviewed - No data to display  EKG None  Radiology No results found.  Procedures Procedures (including critical care time)  Medications Ordered in ED Medications - No data to display   Initial Impression / Assessment and Plan / ED Course  I have reviewed the triage vital signs and the nursing notes.  Pertinent labs & imaging results that were available during my care of the patient were reviewed by me and considered in my medical decision making (see chart for details).        MDM  Screen complete  Wayne Figueroa was evaluated in Emergency Department on 12/30/2018 for the symptoms described in the history of present illness. He was evaluated in the context of the global COVID-19 pandemic, which necessitated consideration that the patient might be at risk for infection with the SARS-CoV-2 virus that causes COVID-19. Institutional protocols and algorithms that pertain to the evaluation of patients at risk for COVID-19 are in a state of rapid change based on information released by regulatory bodies including the CDC and federal and state organizations. These policies and algorithms were followed during the patient's care in the ED.  Patient is presenting for evaluation of a small superficial skin abscess on the right cheek.  Patient has easily expressible purulence from this.  Patient declines I&D to open up the wound.  He does request  repeat course of antibiotics.  He is advised the need for close follow-up.  Strict return precautions given and understood.  He will be discharged with a course of clindamycin.  He understands plan for home care.  He understands need for follow-up with dermatology and/or plastic surgery for excision of the underlying cyst - he reports that he already has a plan for this.   Final Clinical Impressions(s) / ED Diagnoses   Final diagnoses:  Abscess    ED Discharge Orders         Ordered    clindamycin (CLEOCIN) 300 MG capsule  Every 6 hours     12/30/18 1116  Wynetta Fines, MD 12/30/18 1122

## 2018-12-30 NOTE — ED Notes (Signed)
Patient verbalizes understanding of discharge instructions . Opportunity for questions and answers were provided . Armband removed by staff ,Pt discharged from ED. W/C  offered at D/C  and Declined W/C at D/C and was escorted to lobby by RN.  

## 2019-02-07 ENCOUNTER — Other Ambulatory Visit: Payer: Self-pay

## 2019-02-07 ENCOUNTER — Encounter (HOSPITAL_COMMUNITY): Payer: Self-pay | Admitting: *Deleted

## 2019-02-07 ENCOUNTER — Emergency Department (HOSPITAL_COMMUNITY)
Admission: EM | Admit: 2019-02-07 | Discharge: 2019-02-07 | Disposition: A | Discharge status: Home / Self Care | Payer: BC Managed Care – PPO | Attending: Emergency Medicine | Admitting: Emergency Medicine

## 2019-02-07 DIAGNOSIS — F17210 Nicotine dependence, cigarettes, uncomplicated: Secondary | ICD-10-CM | POA: Insufficient documentation

## 2019-02-07 DIAGNOSIS — L0291 Cutaneous abscess, unspecified: Secondary | ICD-10-CM

## 2019-02-07 DIAGNOSIS — L0201 Cutaneous abscess of face: Secondary | ICD-10-CM | POA: Insufficient documentation

## 2019-02-07 MED ORDER — CLINDAMYCIN HCL 300 MG PO CAPS: 300.0000 mg | ORAL_CAPSULE | Freq: Four times a day (QID) | ORAL | 0 refills | Status: DC

## 2019-02-07 NOTE — Discharge Instructions (Signed)
Take antibiotics as directed.  If the area becomes increasingly larger red or swollen despite antibiotic she will need to return for drainage.  Follow-up with your doctor as planned.

## 2019-02-07 NOTE — ED Triage Notes (Signed)
C/o knot on the right lower jaw, states he has appointment with doctor June 29 states he was seen here for same and was given antibiotics of which helped.

## 2019-02-07 NOTE — ED Provider Notes (Signed)
[facility] EMERGENCY DEPARTMENT Provider Note   CSN: [id number] Arrival date & time: 02/07/19  6010    History   Chief Complaint Chief Complaint  Patient presents with   Abscess    HPI Wayne Figueroa is a 38 y.o. male.     Wayne Figueroa is a 38 y.o. male who is otherwise healthy, presents to the emergency department for evaluation of area of swelling on the right lower jaw over his facial hair.  He was seen for similar in April and was treated with clindamycin when she reports significantly improved his symptoms, at that time the area was draining and he declined I&D to further open the wound.  He reports that it is smaller now but he wanted to come in earlier before it got bad.  He reports he has a follow-up appointment with a regular doctor on June 29 regarding this it has been a recurrent issue.  He again expresses a desire to not have an incision and drainage performed.  He has not had any purulent drainage from the area, it started to come back and worsen over the past week.  He does report that it completely resolved after his last course of clindamycin and this worked well for him he tolerated it with minimal side effects.  He has not had any fevers or chills, no nausea or vomiting, no swelling extending to the neck, no difficulty breathing or swallowing.  He does not have any pain over his teeth or intraoral involvement.  No other aggravating relieving factors.     History reviewed. No pertinent past medical history.  There are no active problems to display for this patient.   History reviewed. No pertinent surgical history.      Home Medications    Prior to Admission medications   Medication Sig Start Date End Date Taking? Authorizing Provider  clindamycin (CLEOCIN) 300 MG capsule Take 1 capsule (300 mg total) by mouth 4 (four) times daily. X 7 days 02/07/19   Dartha Lodge, PA-C  methocarbamol (ROBAXIN) 500 MG tablet Take 1 tablet  (500 mg total) by mouth 2 (two) times daily. Patient not taking: Reported on 05/30/2018 05/23/18   Dietrich Pates, PA-C  oxyCODONE-acetaminophen (PERCOCET/ROXICET) 5-325 MG tablet Take 1 tablet by mouth every 6 (six) hours as needed for severe pain. 11/05/18   Hedges, Tinnie Gens, PA-C  predniSONE (STERAPRED UNI-PAK 21 TAB) 10 MG (21) TBPK tablet Take by mouth daily. Take 6 tabs by mouth daily  for 2 days, then 5 tabs for 2 days, then 4 tabs for 2 days, then 3 tabs for 2 days, 2 tabs for 2 days, then 1 tab by mouth daily for 2 days Patient not taking: Reported on 05/30/2018 05/23/18   Dietrich Pates, PA-C    Family History No family history on file.  Social History Social History   Tobacco Use   Smoking status: Current Every Day Smoker    Packs/day: 0.33    Types: Cigarettes   Smokeless tobacco: Never Used  Substance Use Topics   Alcohol use: Yes    Comment: socially   Drug use: Never     Allergies   Patient has no known allergies.   Review of Systems Review of Systems  Constitutional: Negative for chills and fever.  HENT: Positive for facial swelling. Negative for dental problem.   Gastrointestinal: Negative for nausea and vomiting.  Musculoskeletal: Negative for neck pain.  Skin: Positive for color change.  abscess     Physical Exam Updated Vital Signs BP 131/85 (BP Location: Right Arm)    Pulse 98    Temp 98.1 F (36.7 C) (Oral)    Resp 16    Ht 6\' 2"  (1.88 m)    Wt 82.1 kg    SpO2 98%    BMI 23.24 kg/m   Physical Exam Vitals signs and nursing note reviewed.  Constitutional:      General: He is not in acute distress.    Appearance: Normal appearance. He is well-developed and normal weight. He is not diaphoretic.  HENT:     Head: Normocephalic and atraumatic.      Mouth/Throat:     Comments: No evidence of intraoral involvement, no teeth are tender to palpation, no erythema or abscess noted within the mouth.  Posterior oropharynx clear.  Mucous membranes moist.   Normal phonation, no difficulty tolerating secretions. Eyes:     General:        Right eye: No discharge.        Left eye: No discharge.  Pulmonary:     Effort: Pulmonary effort is normal. No respiratory distress.  Neurological:     Mental Status: He is alert.     Coordination: Coordination normal.  Psychiatric:        Behavior: Behavior normal.      ED Treatments / Results  Labs (all labs ordered are listed, but only abnormal results are displayed) Labs Reviewed - No data to display  EKG None  Radiology No results found.  Procedures Procedures (including critical care time)  Medications Ordered in ED Medications - No data to display   Initial Impression / Assessment and Plan / ED Course  I have reviewed the triage vital signs and the nursing notes.  Pertinent labs & imaging results that were available during my care of the patient were reviewed by me and considered in my medical decision making (see chart for details).  Patient with superficial abscess around the hairs of his beard on the right side of his face, he has had this previously and it improved with clindamycin he has upcoming appointment to see his primary care doctor regarding this but over the past week has had increased swelling and redness to the area, no purulent drainage.  Offered I&D but patient declined, will prescribe clindamycin as this seemed to help in the past but discussed with the patient that if despite antibiotics this area continues to worsen he will need to return for incision and drainage.  He expresses understanding and agreement with plan.  Discharged home in good condition.  Final Clinical Impressions(s) / ED Diagnoses   Final diagnoses:  Abscess    ED Discharge Orders         Ordered    clindamycin (CLEOCIN) 300 MG capsule  4 times daily     02/07/19 1059           Dartha LodgeFord, Jessamine Barcia N, [place]PA-C 02/07/19 1316    Derwood KaplanNanavati, Ankit, MD 02/08/19 1722

## 2019-04-05 ENCOUNTER — Emergency Department (HOSPITAL_COMMUNITY)
Admission: EM | Admit: 2019-04-05 | Discharge: 2019-04-05 | Disposition: A | Discharge status: Home / Self Care | Payer: BC Managed Care – PPO | Attending: Emergency Medicine | Admitting: Emergency Medicine

## 2019-04-05 ENCOUNTER — Encounter (HOSPITAL_COMMUNITY): Payer: Self-pay | Admitting: Emergency Medicine

## 2019-04-05 ENCOUNTER — Other Ambulatory Visit: Payer: Self-pay

## 2019-04-05 DIAGNOSIS — F17210 Nicotine dependence, cigarettes, uncomplicated: Secondary | ICD-10-CM | POA: Insufficient documentation

## 2019-04-05 DIAGNOSIS — L0201 Cutaneous abscess of face: Secondary | ICD-10-CM | POA: Insufficient documentation

## 2019-04-05 MED ORDER — CLINDAMYCIN HCL 150 MG PO CAPS: 300.0000 mg | ORAL_CAPSULE | Freq: Four times a day (QID) | ORAL | 0 refills | Status: AC

## 2019-04-05 NOTE — ED Provider Notes (Signed)
[facility]   CSN: [id number] Arrival date & time: 04/05/19  1019    History   Chief Complaint Chief Complaint  Patient presents with  . Facial Pain    HPI Wayne Figueroa is a 38 y.o. male presents for evaluation of acute onset, persistent area of swelling and fluctuance to the right cheek for 2 days.  Reports a history of the same and has been seen in the ED multiple times this year for it.  Reports this feels similar.  Has declined I&D in the past and states he would not like one today.  No drainage from the area.  He reports mild tenderness that occurs primarily with sleeping at night because he sleeps with the right side of his face against his pillow.  Pain does not radiate.  He denies any fevers, difficulty breathing or swallowing, dental pain, neck pain, or neck stiffness.  He has been keeping it clean and washing with soap and water.  He is requesting antibiotics.  He did have an appointment to see a dermatologist at the end of last month but did not make it to the appointment because he was working.     The history is provided by the patient.    History reviewed. No pertinent past medical history.  There are no active problems to display for this patient.   History reviewed. No pertinent surgical history.      Home Medications    Prior to Admission medications   Medication Sig Start Date End Date Taking? Authorizing Provider  clindamycin (CLEOCIN) 150 MG capsule Take 2 capsules (300 mg total) by mouth 4 (four) times daily for 7 days. 04/05/19 04/12/19  Michela PitcherFawze, Koda Routon A, PA-C  methocarbamol (ROBAXIN) 500 MG tablet Take 1 tablet (500 mg total) by mouth 2 (two) times daily. Patient not taking: Reported on 05/30/2018 05/23/18   Dietrich PatesKhatri, Hina, PA-C  oxyCODONE-acetaminophen (PERCOCET/ROXICET) 5-325 MG tablet Take 1 tablet by mouth every 6 (six) hours as needed for severe pain. 11/05/18   Hedges, Tinnie GensJeffrey, PA-C  predniSONE  (STERAPRED UNI-PAK 21 TAB) 10 MG (21) TBPK tablet Take by mouth daily. Take 6 tabs by mouth daily  for 2 days, then 5 tabs for 2 days, then 4 tabs for 2 days, then 3 tabs for 2 days, 2 tabs for 2 days, then 1 tab by mouth daily for 2 days Patient not taking: Reported on 05/30/2018 05/23/18   Dietrich PatesKhatri, Hina, PA-C    Family History No family history on file.  Social History Social History   Tobacco Use  . Smoking status: Current Every Day Smoker    Packs/day: 0.33    Types: Cigarettes  . Smokeless tobacco: Never Used  Substance Use Topics  . Alcohol use: Yes    Comment: socially  . Drug use: Never     Allergies   Patient has no known allergies.   Review of Systems Review of Systems  Constitutional: Negative for fever.  HENT: Positive for facial swelling. Negative for trouble swallowing.   Respiratory: Negative for shortness of breath.   Musculoskeletal: Negative for neck pain and neck stiffness.  Skin:       +abscess     Physical Exam Updated Vital Signs BP (!) 143/87 (BP Location: Right Arm)   Pulse 78   Temp 98.5 F (36.9 C)   Resp 16   SpO2 98%   Physical Exam Vitals signs and nursing note reviewed.  Constitutional:  General: He is not in acute distress.    Appearance: He is well-developed.     Comments: Resting comfortably in bed  HENT:     Head: Normocephalic and atraumatic.     Comments: 1 x 1 cm area of induration and central fluctuance to the right lower cheek lateral to the lips.  No active drainage.  No erythema.  Dentition appears stable, no trismus and mouth opening to at least 3 finger widths.  He is tolerating secretions without difficulty.  No tenderness to percussion of the dentition, no drainable abscess noted along the gingiva.  No submandibular or submental swelling or tenderness. Eyes:     General:        Right eye: No discharge.        Left eye: No discharge.     Conjunctiva/sclera: Conjunctivae normal.  Neck:     Musculoskeletal: Normal  range of motion and neck supple. No neck rigidity.     Vascular: No JVD.     Trachea: No tracheal deviation.  Cardiovascular:     Rate and Rhythm: Normal rate.  Pulmonary:     Effort: Pulmonary effort is normal.  Abdominal:     General: There is no distension.  Skin:    General: Skin is warm and dry.     Findings: No erythema.  Neurological:     Mental Status: He is alert.  Psychiatric:        Behavior: Behavior normal.      ED Treatments / Results  Labs (all labs ordered are listed, but only abnormal results are displayed) Labs Reviewed - No data to display  EKG None  Radiology No results found.  Procedures Procedures (including critical care time)  Medications Ordered in ED Medications - No data to display   Initial Impression / Assessment and Plan / ED Course  I have reviewed the triage vital signs and the nursing notes.  Pertinent labs & imaging results that were available during my care of the patient were reviewed by me and considered in my medical decision making (see chart for details).        Patient with recurrent facial abscess, symptoms for 2 days at this time.  He is afebrile, vital signs are stable.  He is nontoxic in appearance.  Abscess does not appear to be odontogenic in origin.  He refuses I&D in the ED today.  Has had good success with clindamycin in the past and is requesting the same today.  Tolerating secretions without difficulty, no evidence of deep space neck infection, peritonsillar abscess, Ludwig's angina, or meningitis.  He tells me he did have an appointment to see a dermatologist late last month but did not keep the appointment because he was working.  I encouraged him to follow-up with a specialist as this is his fourth or fifth visit this year for the same complaint.  Will give appropriate follow-up.  Will discharge with course of clindamycin.  Discussed strict ED return precautions. Pt verbalized understanding of and agreement with  plan and is safe for discharge home at this time.   Final Clinical Impressions(s) / ED Diagnoses   Final diagnoses:  Facial abscess    ED Discharge Orders         Ordered    clindamycin (CLEOCIN) 150 MG capsule  4 times daily     04/05/19 [address] [place] 04/05/19 1127    Dorie Rank, MD 04/06/19  0705  

## 2019-04-05 NOTE — ED Triage Notes (Signed)
Has a bump on rt side of lower face thaT HAS BEEN SWELLING  X 2 days , started in gum line tho , states haS BEEN SEEN FOR THIS BEFORE

## 2019-04-05 NOTE — Discharge Instructions (Signed)
Please take all of your antibiotics until finished!   You may develop abdominal discomfort or diarrhea from the antibiotic.  You may help offset this with probiotics which you can buy or get in yogurt. Do not eat  or take the probiotics until 2 hours after your antibiotic.   You can take ibuprofen or Tylenol as needed for pain.  Apply warm compresses a few times daily.  This may help bring the area to ahead and start draining but do not actively push on the area.  Follow-up with a dermatologist will be very important so that this can be managed long-term as it keeps returning.  Please call to make an appointment for soon as possible.  Return to the emergency department if any concerning signs or symptoms develop such as fevers, difficulty breathing or swallowing, swelling of the tongue, lips, or neck, difficulty moving the neck or neck stiffness.

## 2019-06-21 ENCOUNTER — Emergency Department (HOSPITAL_COMMUNITY): Payer: Self-pay

## 2019-06-21 ENCOUNTER — Other Ambulatory Visit: Payer: Self-pay

## 2019-06-21 ENCOUNTER — Emergency Department (HOSPITAL_COMMUNITY)
Admission: EM | Admit: 2019-06-21 | Discharge: 2019-06-21 | Disposition: A | Discharge status: Home / Self Care | Payer: Self-pay | Attending: Emergency Medicine | Admitting: Emergency Medicine

## 2019-06-21 DIAGNOSIS — Z79899 Other long term (current) drug therapy: Secondary | ICD-10-CM | POA: Insufficient documentation

## 2019-06-21 DIAGNOSIS — Y999 Unspecified external cause status: Secondary | ICD-10-CM | POA: Insufficient documentation

## 2019-06-21 DIAGNOSIS — S62647A Nondisplaced fracture of proximal phalanx of left little finger, initial encounter for closed fracture: Secondary | ICD-10-CM | POA: Insufficient documentation

## 2019-06-21 DIAGNOSIS — Y9367 Activity, basketball: Secondary | ICD-10-CM | POA: Insufficient documentation

## 2019-06-21 DIAGNOSIS — Y929 Unspecified place or not applicable: Secondary | ICD-10-CM | POA: Insufficient documentation

## 2019-06-21 DIAGNOSIS — W51XXXA Accidental striking against or bumped into by another person, initial encounter: Secondary | ICD-10-CM | POA: Insufficient documentation

## 2019-06-21 DIAGNOSIS — F17210 Nicotine dependence, cigarettes, uncomplicated: Secondary | ICD-10-CM | POA: Insufficient documentation

## 2019-06-21 MED ORDER — NAPROXEN 500 MG PO TABS: 500.0000 mg | ORAL_TABLET | Freq: Two times a day (BID) | ORAL | 0 refills | Status: AC

## 2019-06-21 NOTE — Progress Notes (Signed)
Orthopedic Tech Progress Note Patient Details:  Wayne Figueroa [birth year] [id number]  Ortho Devices Type of Ortho Device: Finger splint Ortho Device/Splint Location: ULE Ortho Device/Splint Interventions: Adjustment, Application, Ordered   Post Interventions Patient Tolerated: Well Instructions Provided: Care of device, Adjustment of device   Janit Pagan 06/21/2019, 12:03 PM

## 2019-06-21 NOTE — ED Triage Notes (Signed)
Pt reports hitting Left hand on someone's back yesterday, while playing basketball. Middle, ring, and pinky bent backwards. Reports hearing 3 cracks. Left hand discolored and swelling.

## 2019-06-21 NOTE — ED Notes (Signed)
Ortho tech called 

## 2019-06-21 NOTE — ED Provider Notes (Signed)
[facility] EMERGENCY DEPARTMENT Provider Note   CSN: [id number] Arrival date & time: 06/21/19  1036     History   Chief Complaint Chief Complaint  Patient presents with  . Hand Injury    HPI Wayne Figueroa is a 38 y.o. male.     38 y.o male with no PMH presents to the ED with a chief complaint of left hand pain x yesterday.  Patient reports he was playing basketball yesterday when he felt his left small finger bent backwards, he then heard 3 popping sensations.  Patient reports afterwards he noticed his finger to be more swollen, states he has taken some naproxen but this did not help much with the pain.  Patient reports he has pain with flexing of his left little finger.  He denies any other injuries.  The history is provided by the patient.    No past medical history on file.  There are no active problems to display for this patient.   No past surgical history on file.      Home Medications    Prior to Admission medications   Medication Sig Start Date End Date Taking? Authorizing Provider  methocarbamol (ROBAXIN) 500 MG tablet Take 1 tablet (500 mg total) by mouth 2 (two) times daily. Patient not taking: Reported on 05/30/2018 05/23/18   Delia Heady, PA-C  naproxen (NAPROSYN) 500 MG tablet Take 1 tablet (500 mg total) by mouth 2 (two) times daily for 5 days. 06/21/19 06/26/19  Janeece Fitting, PA-C  oxyCODONE-acetaminophen (PERCOCET/ROXICET) 5-325 MG tablet Take 1 tablet by mouth every 6 (six) hours as needed for severe pain. 11/05/18   Hedges, Dellis Filbert, PA-C  predniSONE (STERAPRED UNI-PAK 21 TAB) 10 MG (21) TBPK tablet Take by mouth daily. Take 6 tabs by mouth daily  for 2 days, then 5 tabs for 2 days, then 4 tabs for 2 days, then 3 tabs for 2 days, 2 tabs for 2 days, then 1 tab by mouth daily for 2 days Patient not taking: Reported on 05/30/2018 05/23/18   Delia Heady, PA-C    Family History No family history on file.  Social History  Social History   Tobacco Use  . Smoking status: Current Every Day Smoker    Packs/day: 0.33    Types: Cigarettes  . Smokeless tobacco: Never Used  Substance Use Topics  . Alcohol use: Yes    Comment: socially  . Drug use: Never     Allergies   Patient has no known allergies.   Review of Systems Review of Systems  Constitutional: Negative for fever.  Musculoskeletal: Positive for arthralgias.     Physical Exam Updated Vital Signs BP (!) 150/99 (BP Location: Left Arm)   Pulse 65   Temp 98.2 F (36.8 C) (Oral)   Resp 16   Ht 6\' 2"  (1.88 m)   Wt 82.6 kg   SpO2 98%   BMI 23.37 kg/m   Physical Exam Vitals signs and nursing note reviewed.  Constitutional:      Appearance: He is well-developed.  HENT:     Head: Normocephalic and atraumatic.  Eyes:     General: No scleral icterus.    Pupils: Pupils are equal, round, and reactive to light.  Neck:     Musculoskeletal: Normal range of motion.  Cardiovascular:     Heart sounds: Normal heart sounds.  Pulmonary:     Effort: Pulmonary effort is normal.     Breath sounds: Normal breath sounds. No wheezing.  Chest:     Chest wall: No tenderness.  Abdominal:     General: Bowel sounds are normal. There is no distension.     Palpations: Abdomen is soft.     Tenderness: There is no abdominal tenderness.  Musculoskeletal:        General: No deformity.     Left hand: He exhibits tenderness. He exhibits normal range of motion, no deformity, no laceration and no swelling. Normal sensation noted. Normal strength noted.       Hands:  Skin:    General: Skin is warm and dry.  Neurological:     Mental Status: He is alert and oriented to person, place, and time.      ED Treatments / Results  Labs (all labs ordered are listed, but only abnormal results are displayed) Labs Reviewed - No data to display  EKG None  Radiology Dg Hand Complete Left  Result Date: 06/21/2019 CLINICAL DATA:  Pain, bruising, and swelling  after blunt trauma yesterday playing basketball. EXAM: LEFT HAND - COMPLETE 3+ VIEW COMPARISON:  None. FINDINGS: There is a slightly angulated nondisplaced fracture of the base of the proximal phalanx of the fifth digit. The other bones of the right hand are normal. IMPRESSION: Fracture of the base of the proximal phalanx of left little finger. Electronically Signed   By: Francene Boyers M.D.   On: 06/21/2019 11:13    Procedures Procedures (including critical care time)  Medications Ordered in ED Medications - No data to display   Initial Impression / Assessment and Plan / ED Course  I have reviewed the triage vital signs and the nursing notes.  Pertinent labs & imaging results that were available during my care of the patient were reviewed by me and considered in my medical decision making (see chart for details).       Patient with no pertinent past medical history presents to the ED with complaints of left pinky pain after playing basketball yesterday reports his finger went backwards.  During evaluation there is swelling to the finger, pulses are present, capillary refill is intact, does have some decreased range of motion due to pain.  Has taken some naproxen without improvement in symptoms.  X-ray showed  Xray showed: Fracture of the base of the proximal phalanx of left little finger.   Patient was placed on a finger splint, to help with healing process, advised to follow-up with Dr. Lovie Macadamia hand specialist on call.  Advised to keep splint until seen hand specialist.  Patient understands and agrees with management return precautions provided.   Portions of this note were generated with Scientist, clinical (histocompatibility and immunogenetics). Dictation errors may occur despite best attempts at proofreading.  Final Clinical Impressions(s) / ED Diagnoses   Final diagnoses:  Nondisplaced fracture of proximal phalanx of left little finger, initial encounter for closed fracture    ED Discharge Orders          Ordered    naproxen (NAPROSYN) 500 MG tablet  2 times daily     06/21/19 1132           Claude Manges, PA-C 06/21/19 1144    Margarita Grizzle, MD 06/24/19 1238

## 2019-06-21 NOTE — Discharge Instructions (Addendum)
I have prescribed a short course of anti-inflammatories to help with your pain, please take 1 tablet twice a day for the next 7 days.  The number to Dr. Dallie Piles, hand specialist is attached to your chart, please schedule an appointment for further management of your left pinky finger fracture.

## 2019-09-22 ENCOUNTER — Encounter (HOSPITAL_COMMUNITY): Payer: Self-pay | Admitting: Emergency Medicine

## 2019-09-22 ENCOUNTER — Emergency Department (HOSPITAL_COMMUNITY)
Admission: EM | Admit: 2019-09-22 | Discharge: 2019-09-22 | Disposition: A | Discharge status: Home / Self Care | Payer: Self-pay | Attending: Emergency Medicine | Admitting: Emergency Medicine

## 2019-09-22 ENCOUNTER — Other Ambulatory Visit: Payer: Self-pay

## 2019-09-22 DIAGNOSIS — L0201 Cutaneous abscess of face: Secondary | ICD-10-CM | POA: Insufficient documentation

## 2019-09-22 DIAGNOSIS — L0291 Cutaneous abscess, unspecified: Secondary | ICD-10-CM

## 2019-09-22 DIAGNOSIS — F17210 Nicotine dependence, cigarettes, uncomplicated: Secondary | ICD-10-CM | POA: Insufficient documentation

## 2019-09-22 MED ORDER — LIDOCAINE HCL 2 % IJ SOLN
10.0000 mL | Freq: Once | INTRAMUSCULAR | Status: AC
  Administered 2019-09-22: 200 mg via INTRADERMAL
  Filled 2019-09-22: qty 20

## 2019-09-22 MED ORDER — CLINDAMYCIN HCL 300 MG PO CAPS: 300.0000 mg | ORAL_CAPSULE | Freq: Three times a day (TID) | ORAL | 0 refills | Status: AC

## 2019-09-22 NOTE — ED Triage Notes (Signed)
Pt with redness and swelling to R cheek x2 days, reports hx of the same about 6 months ago, states he came here and was given medication that cleared it up but it has returned now.

## 2019-09-22 NOTE — ED Provider Notes (Addendum)
[place] [facility] EMERGENCY DEPARTMENT Provider Note   CSN: [id number] Arrival date & time: 09/22/19  1302     History Chief Complaint  Patient presents with  . Facial Swelling    Wayne Figueroa is a 39 y.o. male.  HPI   39 year old male presenting for evaluation of right cheek swelling and pain.  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  He has been given antibiotics in the past and the abscess has resolved.  He has had some mild drainage from the area.  He denies any difficulty opening his mouth or fevers.  History reviewed. No pertinent past medical history.  There are no problems to display for this patient.   History reviewed. No pertinent surgical history.     No family history on file.  Social History   Tobacco Use  . Smoking status: Current Every Day Smoker    Packs/day: 0.33    Types: Cigarettes  . Smokeless tobacco: Never Used  Substance Use Topics  . Alcohol use: Yes    Comment: socially  . Drug use: Never    Home Medications Prior to Admission medications   Medication Sig Start Date End Date Taking? Authorizing Provider  clindamycin (CLEOCIN) 300 MG capsule Take 1 capsule (300 mg total) by mouth 3 (three) times daily for 7 days. 09/22/19 09/29/19  Rajni Holsworth S, PA-C  methocarbamol (ROBAXIN) 500 MG tablet Take 1 tablet (500 mg total) by mouth 2 (two) times daily. Patient not taking: Reported on 05/30/2018 05/23/18   Delia Heady, PA-C  oxyCODONE-acetaminophen (PERCOCET/ROXICET) 5-325 MG tablet Take 1 tablet by mouth every 6 (six) hours as needed for severe pain. 11/05/18   Hedges, Dellis Filbert, PA-C  predniSONE (STERAPRED UNI-PAK 21 TAB) 10 MG (21) TBPK tablet Take by mouth daily. Take 6 tabs by mouth daily  for 2 days, then 5 tabs for 2 days, then 4 tabs for 2 days, then 3 tabs for 2 days, 2 tabs for 2 days, then 1 tab by mouth daily for 2  days Patient not taking: Reported on 05/30/2018 05/23/18   Delia Heady, PA-C    Allergies    Patient has no known allergies.  Review of Systems   Review of Systems  Constitutional: Negative for fever.  HENT:       Right facial swelling  Skin: Positive for color change and wound.    Physical Exam Updated Vital Signs BP (!) 156/98 (BP Location: Left Arm)   Pulse 86   Temp 99 F (37.2 C) (Oral)   Resp 16   SpO2 100%   Physical Exam Constitutional:      General: He is not in acute distress.    Appearance: He is well-developed.  HENT:     Mouth/Throat:     Comments: 1.5x1.5cm area of fluctuance, erythema, warmth and ttp to the right cheek just lateral to the mouth. No active drainage on exam. No trismus or ttp of the teeth. No swelling to the gumline.  Eyes:     Conjunctiva/sclera: Conjunctivae normal.  Cardiovascular:     Rate and Rhythm: Normal rate and regular rhythm.  Pulmonary:     Effort: Pulmonary effort is normal.     Breath sounds: Normal breath sounds.  Skin:    General: Skin is warm and dry.  Neurological:     Mental Status: He is alert and oriented to person,  place, and time.     ED Results / Procedures / Treatments   Labs (all labs ordered are listed, but only abnormal results are displayed) Labs Reviewed - No data to display  EKG None  Radiology No results found.  Procedures .Marland KitchenIncision and Drainage  Date/Time: 09/22/2019 3:04 PM Performed by: Karrie Meres, PA-C Authorized by: Karrie Meres, PA-C   Consent:    Consent obtained:  Verbal   Consent given by:  Patient   Risks discussed:  Bleeding, incomplete drainage, pain and infection (scarring)   Alternatives discussed:  No treatment Location:    Type:  Abscess   Size:  1.5   Location: R cheek. Pre-procedure details:    Skin preparation:  Betadine Anesthesia (see MAR for exact dosages):    Anesthesia method:  Local infiltration   Local anesthetic:  Lidocaine 2% w/o  epi Procedure type:    Complexity:  Simple Procedure details:    Needle aspiration: no     Incision types:  Stab incision   Incision depth:  Dermal   Scalpel blade:  11   Drainage:  Purulent   Drainage amount:  Scant   Wound treatment:  Wound left open   Packing materials:  None Post-procedure details:    Patient tolerance of procedure:  Tolerated well, no immediate complications   (including critical care time)  Medications Ordered in ED Medications  lidocaine (XYLOCAINE) 2 % (with pres) injection 200 mg (has no administration in time range)    ED Course  I have reviewed the triage vital signs and the nursing notes.  Pertinent labs & imaging results that were available during my care of the patient were reviewed by me and considered in my medical decision making (see chart for details).    MDM Rules/Calculators/A&P                      Patient with skin abscess amenable to incision and drainage.  Abscess was not large enough to warrant packing or drain,  wound recheck in 2 days if worse. Encouraged home warm soaks and flushing.  Mild signs of cellulitis is surrounding skin.  Will d/c to home with abx. F/u with plastics given. Return precautions discussed. He voices understanding of the plan and reasons to return. All questions answered, pt stable for d/.c  Final Clinical Impression(s) / ED Diagnoses Final diagnoses:  Abscess    Rx / DC Orders ED Discharge Orders         Ordered    clindamycin (CLEOCIN) 300 MG capsule  3 times daily     09/22/19 1552           Devun Anna S, PA-C 09/22/19 1552    Shilee Biggs S, PA-C 09/22/19 1553    Tarrance Januszewski S, PA-C 09/22/19 1554    Virgina Norfolk, DO 09/23/19 [phone]

## 2019-09-22 NOTE — Discharge Instructions (Addendum)
You were given a prescription for antibiotics. Please take the antibiotic prescription fully.   Please follow up with plastic surgery within 5-7 days for re-evaluation of your symptoms.   Please return to the emergency room immediately if you experience any new or worsening symptoms or any symptoms that indicate worsening infection such as fevers, increased redness/swelling/pain, warmth, or drainage from the affected area.

## 2019-10-17 ENCOUNTER — Encounter (HOSPITAL_COMMUNITY): Payer: Self-pay | Admitting: Emergency Medicine

## 2019-10-17 ENCOUNTER — Emergency Department (HOSPITAL_COMMUNITY)
Admission: EM | Admit: 2019-10-17 | Discharge: 2019-10-17 | Disposition: A | Discharge status: Home / Self Care | Payer: BLUE CROSS/BLUE SHIELD | Attending: Emergency Medicine | Admitting: Emergency Medicine

## 2019-10-17 ENCOUNTER — Other Ambulatory Visit: Payer: Self-pay

## 2019-10-17 DIAGNOSIS — F17210 Nicotine dependence, cigarettes, uncomplicated: Secondary | ICD-10-CM | POA: Insufficient documentation

## 2019-10-17 DIAGNOSIS — R238 Other skin changes: Secondary | ICD-10-CM | POA: Insufficient documentation

## 2019-10-17 MED ORDER — CLINDAMYCIN HCL 150 MG PO CAPS
300.0000 mg | ORAL_CAPSULE | Freq: Once | ORAL | Status: AC
  Administered 2019-10-17: 300 mg via ORAL
  Filled 2019-10-17: qty 2

## 2019-10-17 MED ORDER — CLINDAMYCIN HCL 300 MG PO CAPS: 300.0000 mg | ORAL_CAPSULE | Freq: Four times a day (QID) | ORAL | 0 refills | Status: DC

## 2019-10-17 MED ORDER — NAPROXEN 250 MG PO TABS
500.0000 mg | ORAL_TABLET | ORAL | Status: AC
  Administered 2019-10-17: 02:00:00 500 mg via ORAL
  Filled 2019-10-17: qty 2

## 2019-10-17 NOTE — ED Triage Notes (Signed)
Patient reports worsening right lower molar pain with mild swelling onset this week .

## 2019-10-17 NOTE — ED Provider Notes (Addendum)
[facility] EMERGENCY DEPARTMENT Provider Note   CSN: [id number] Arrival date & time: 10/17/19  0007     History Chief Complaint  Patient presents with  . Dental Pain    Wayne Figueroa is a 39 y.o. male.  The history is provided by the patient.  Illness Location:  Right chin Quality:  Infected pimple Severity:  Mild Onset quality:  Gradual Timing:  Constant Progression:  Worsening Chronicity:  Recurrent Context:  Seen for same in January  Relieved by:  Nothing Worsened by:  Nothing  Ineffective treatments:  None tried  Associated symptoms: no abdominal pain, no chest pain, no cough, no fever and no rash        History reviewed. No pertinent past medical history.  There are no problems to display for this patient.   History reviewed. No pertinent surgical history.     History reviewed. No pertinent family history.  Social History   Tobacco Use  . Smoking status: Current Every Day Smoker    Packs/day: 0.33    Types: Cigarettes  . Smokeless tobacco: Never Used  Substance Use Topics  . Alcohol use: Yes    Comment: socially  . Drug use: Never    Home Medications Prior to Admission medications   Medication Sig Start Date End Date Taking? Authorizing Provider  methocarbamol (ROBAXIN) 500 MG tablet Take 1 tablet (500 mg total) by mouth 2 (two) times daily. Patient not taking: Reported on 05/30/2018 05/23/18   Delia Heady, PA-C  oxyCODONE-acetaminophen (PERCOCET/ROXICET) 5-325 MG tablet Take 1 tablet by mouth every 6 (six) hours as needed for severe pain. 11/05/18   Hedges, Dellis Filbert, PA-C  predniSONE (STERAPRED UNI-PAK 21 TAB) 10 MG (21) TBPK tablet Take by mouth daily. Take 6 tabs by mouth daily  for 2 days, then 5 tabs for 2 days, then 4 tabs for 2 days, then 3 tabs for 2 days, 2 tabs for 2 days, then 1 tab by mouth daily for 2 days Patient not taking: Reported on 05/30/2018 05/23/18   Delia Heady, PA-C    Allergies    Patient has no  known allergies.  Review of Systems   Review of Systems  Constitutional: Negative for fever.  HENT: Negative for dental problem, drooling and trouble swallowing.   Eyes: Negative for visual disturbance.  Respiratory: Negative for cough.   Cardiovascular: Negative for chest pain.  Gastrointestinal: Negative for abdominal pain.  Genitourinary: Negative for difficulty urinating.  Musculoskeletal: Negative for arthralgias.  Skin: Negative for rash.  Neurological: Negative for dizziness.  Psychiatric/Behavioral: Negative for agitation.  All other systems reviewed and are negative.   Physical Exam Updated Vital Signs Pulse 92   Temp 98.1 F (36.7 C) (Oral)   Resp 16   Physical Exam Vitals and nursing note reviewed.  Constitutional:      General: He is not in acute distress.    Appearance: Normal appearance.  HENT:     Head: Normocephalic and atraumatic.      Nose: Nose normal.  Eyes:     Conjunctiva/sclera: Conjunctivae normal.     Pupils: Pupils are equal, round, and reactive to light.  Cardiovascular:     Rate and Rhythm: Normal rate and regular rhythm.     Pulses: Normal pulses.     Heart sounds: Normal heart sounds.  Pulmonary:     Effort: Pulmonary effort is normal.     Breath sounds: Normal breath sounds.  Abdominal:     General: Abdomen is flat.  Bowel sounds are normal.     Tenderness: There is no abdominal tenderness. There is no guarding or rebound.  Musculoskeletal:        General: Normal range of motion.     Cervical back: Normal range of motion and neck supple.  Skin:    General: Skin is warm and dry.     Capillary Refill: Capillary refill takes less than 2 seconds.  Neurological:     Mental Status: He is alert and oriented to person, place, and time.     Deep Tendon Reflexes: Reflexes normal.  Psychiatric:        Mood and Affect: Mood normal.        Behavior: Behavior normal.     ED Results / Procedures / Treatments   Labs (all labs ordered are  listed, but only abnormal results are displayed) Labs Reviewed - No data to display  EKG None  Radiology No results found.  Procedures Procedures (including critical care time)  Medications Ordered in ED Medications  clindamycin (CLEOCIN) capsule 300 mg (has no administration in time range)  naproxen (NAPROSYN) tablet 500 mg (has no administration in time range)    ED Course  I have reviewed the triage vital signs and the nursing notes.  Pertinent labs & imaging results that were available during my care of the patient were reviewed by me and considered in my medical decision making (see chart for details).    Seen for same multiple times in the past.  The patient needs to follow up with a dentist for exam, xrays and definitive care.  Infected pimple use astringent and neosporin.     Karanvir Balderston was evaluated in Emergency Department on 10/17/2019 for the symptoms described in the history of present illness. He was evaluated in the context of the global COVID-19 pandemic, which necessitated consideration that the patient might be at risk for infection with the SARS-CoV-2 virus that causes COVID-19. Institutional protocols and algorithms that pertain to the evaluation of patients at risk for COVID-19 are in a state of rapid change based on information released by regulatory bodies including the CDC and federal and state organizations. These policies and algorithms were followed during the patient's care in the ED.  Final Clinical Impression(s) / ED Diagnoses Final diagnoses:  Dental caries   Return for weakness, numbness, changes in vision or speech, fevers >100.4 unrelieved by medication, shortness of breath, intractable vomiting, or diarrhea, abdominal pain, Inability to tolerate liquids or food, cough, altered mental status or any concerns. No signs of systemic illness or infection. The patient is nontoxic-appearing on exam and vital signs are within normal limits.    I have reviewed the triage vital signs and the nursing notes. Pertinent labs &imaging results that were available during my care of the patient were reviewed by me and considered in my medical decision making (see chart for details).  After history, exam, and medical workup I feel the patient has been appropriately medically screened and is safe for discharge home. Pertinent diagnoses were discussed with the patient. Patient was given return precautions   Dandra Shambaugh, MD 10/17/19 0111    Zaniya Mcaulay, MD 10/17/19 0116

## 2020-07-15 ENCOUNTER — Other Ambulatory Visit: Payer: Self-pay

## 2020-07-15 ENCOUNTER — Emergency Department (HOSPITAL_COMMUNITY): Payer: BC Managed Care – PPO

## 2020-07-15 ENCOUNTER — Emergency Department (HOSPITAL_COMMUNITY)
Admission: EM | Admit: 2020-07-15 | Discharge: 2020-07-15 | Disposition: A | Discharge status: Home / Self Care | Payer: BC Managed Care – PPO | Attending: Emergency Medicine | Admitting: Emergency Medicine

## 2020-07-15 DIAGNOSIS — Y999 Unspecified external cause status: Secondary | ICD-10-CM | POA: Diagnosis not present

## 2020-07-15 DIAGNOSIS — W540XXA Bitten by dog, initial encounter: Secondary | ICD-10-CM | POA: Insufficient documentation

## 2020-07-15 DIAGNOSIS — F17210 Nicotine dependence, cigarettes, uncomplicated: Secondary | ICD-10-CM | POA: Diagnosis not present

## 2020-07-15 DIAGNOSIS — Z23 Encounter for immunization: Secondary | ICD-10-CM | POA: Insufficient documentation

## 2020-07-15 DIAGNOSIS — Y9389 Activity, other specified: Secondary | ICD-10-CM | POA: Insufficient documentation

## 2020-07-15 DIAGNOSIS — Y92008 Other place in unspecified non-institutional (private) residence as the place of occurrence of the external cause: Secondary | ICD-10-CM | POA: Insufficient documentation

## 2020-07-15 DIAGNOSIS — S61254A Open bite of right ring finger without damage to nail, initial encounter: Secondary | ICD-10-CM | POA: Insufficient documentation

## 2020-07-15 DIAGNOSIS — S61256A Open bite of right little finger without damage to nail, initial encounter: Secondary | ICD-10-CM | POA: Insufficient documentation

## 2020-07-15 MED ORDER — AMOXICILLIN-POT CLAVULANATE 875-125 MG PO TABS: 1.0000 | ORAL_TABLET | Freq: Two times a day (BID) | ORAL | 0 refills | Status: DC

## 2020-07-15 MED ORDER — AMOXICILLIN-POT CLAVULANATE 875-125 MG PO TABS
1.0000 | ORAL_TABLET | Freq: Once | ORAL | Status: AC
  Administered 2020-07-15: 1 via ORAL
  Filled 2020-07-15: qty 1

## 2020-07-15 MED ORDER — TETANUS-DIPHTH-ACELL PERTUSSIS 5-2.5-18.5 LF-MCG/0.5 IM SUSY
0.5000 mL | PREFILLED_SYRINGE | Freq: Once | INTRAMUSCULAR | Status: AC
  Administered 2020-07-15: 0.5 mL via INTRAMUSCULAR
  Filled 2020-07-15: qty 0.5

## 2020-07-15 NOTE — ED Provider Notes (Signed)
[facility] EMERGENCY DEPARTMENT Provider Note   CSN: [id number] Arrival date & time: 07/15/20  0848     History Chief Complaint  Patient presents with  . Animal Bite    Wayne Figueroa is a 39 y.o. male who presents to the ED today with animal bite to right hand that occurred approximately 12 hours ago. Pt reports he broke up a fight between his pit bull and his children and in the process was bitten on the right hand along the 4th and 5th fingers. Pt states he washed the wound with hydrogen peroxide however the fingers were swollen today prompting him to come to the ED. Pt reports that his dog is not UTD on its vaccines and nor is the patient. He did not contact animal control last night. No other complaints at this time.   The history is provided by the patient and medical records.       No past medical history on file.  There are no problems to display for this patient.   No past surgical history on file.     No family history on file.  Social History   Tobacco Use  . Smoking status: Current Every Day Smoker    Packs/day: 0.33    Types: Cigarettes  . Smokeless tobacco: Never Used  Vaping Use  . Vaping Use: Never used  Substance Use Topics  . Alcohol use: Yes    Comment: socially  . Drug use: Never    Home Medications Prior to Admission medications   Medication Sig Start Date End Date Taking? Authorizing Provider  amoxicillin-clavulanate (AUGMENTIN) 875-125 MG tablet Take 1 tablet by mouth every 12 (twelve) hours. 07/15/20   Hyman Hopes, Lavar Rosenzweig, PA-C  clindamycin (CLEOCIN) 300 MG capsule Take 1 capsule (300 mg total) by mouth 4 (four) times daily. X 7 days 10/17/19   Nicanor Alcon, April, MD  methocarbamol (ROBAXIN) 500 MG tablet Take 1 tablet (500 mg total) by mouth 2 (two) times daily. Patient not taking: Reported on 05/30/2018 05/23/18   Dietrich Pates, PA-C  oxyCODONE-acetaminophen (PERCOCET/ROXICET) 5-325 MG tablet Take 1 tablet by mouth every  6 (six) hours as needed for severe pain. 11/05/18   Hedges, Tinnie Gens, PA-C  predniSONE (STERAPRED UNI-PAK 21 TAB) 10 MG (21) TBPK tablet Take by mouth daily. Take 6 tabs by mouth daily  for 2 days, then 5 tabs for 2 days, then 4 tabs for 2 days, then 3 tabs for 2 days, 2 tabs for 2 days, then 1 tab by mouth daily for 2 days Patient not taking: Reported on 05/30/2018 05/23/18   Dietrich Pates, PA-C    Allergies    Patient has no known allergies.  Review of Systems   Review of Systems  Constitutional: Negative for chills and fever.  Musculoskeletal: Positive for arthralgias.  Skin: Positive for wound.    Physical Exam Updated Vital Signs BP (!) 154/90 (BP Location: Right Arm)   Pulse 76   Temp 98.3 F (36.8 C) (Oral)   Resp 14   Ht 6\' 2"  (1.88 m)   Wt 82.6 kg   SpO2 100%   BMI 23.37 kg/m   Physical Exam Vitals and nursing note reviewed.  Constitutional:      Appearance: He is not ill-appearing.  HENT:     Head: Normocephalic and atraumatic.  Eyes:     Conjunctiva/sclera: Conjunctivae normal.  Cardiovascular:     Rate and Rhythm: Normal rate and regular rhythm.     Pulses: Normal pulses.  Pulmonary:     Effort: Pulmonary effort is normal.     Breath sounds: Normal breath sounds. No wheezing, rhonchi or rales.  Musculoskeletal:     Comments: Puncture wound noted to distal aspect of right 5th finger; bleeding controlled. More superficial puncture wound noted to distal aspect of 4th finger on right side. Mild swelling noted to both fingers however ROM intact to MCP, PIP, and DIP joints. Cap refill < 2 seconds. 2+ radial pulse.   Skin:    General: Skin is warm and dry.     Coloration: Skin is not jaundiced.  Neurological:     Mental Status: He is alert.     ED Results / Procedures / Treatments   Labs (all labs ordered are listed, but only abnormal results are displayed) Labs Reviewed - No data to display  EKG None  Radiology DG Hand Complete Right  Result Date:  07/15/2020 CLINICAL DATA:  Dog bite. EXAM: RIGHT HAND - COMPLETE 3+ VIEW COMPARISON:  None. FINDINGS: There is no evidence of fracture or dislocation. There is no evidence of arthropathy or other focal bone abnormality. Soft tissues are unremarkable. IMPRESSION: Negative. Electronically Signed   By: Lupita Raider M.D.   On: 07/15/2020 09:45    Procedures Procedures (including critical care time)  Medications Ordered in ED Medications  Tdap (BOOSTRIX) injection 0.5 mL (0.5 mLs Intramuscular Given 07/15/20 1010)  amoxicillin-clavulanate (AUGMENTIN) 875-125 MG per tablet 1 tablet (1 tablet Oral Given 07/15/20 1012)    ED Course  I have reviewed the triage vital signs and the nursing notes.  Pertinent labs & imaging results that were available during my care of the patient were reviewed by me and considered in my medical decision making (see chart for details).    MDM Rules/Calculators/A&P                          39 year old male presents to the ED today after sustaining a dog bite to his right hand along the fourth and fifth fingers last night approximately 12 hours ago.  This is patient's dog however patient's dog is not up-to-date on its vaccines nor is patient.  On arrival to the ED vitals are stable.  Patient is afebrile, nontachycardic nontachypneic.  He appears to be in no acute distress.  He has 2 puncture wounds noted to the distal aspect of his fourth and fifth fingers on the right side, bleeding controlled.  Mild swelling to the fingers as well however range of motion intact.  Neurovascularly intact throughout.  We will plan for x-rays to assess for any foreign bodies including dog teeth.  We will plan to irrigate wounds.  We will plan to update on tetanus and provide Augmentin in the ED as well as prescription for same.  Had given patient the option of [place] contacting animal control and then monitoring the dog for 10 days versus him receiving rabies injections here in the ED today as  well as on days 3, 7, 14.  Patient would like animal control contacted at this time as it is less invasive.  We will do this today.   Xray negative for FB or fractures Pt's wound cleaned by nursing staff and dressing applied. Will discharge home at this time with augmentin. Pt instructed to return for any signs of infection or if he is contacted by animal control if his dog begins showing symptoms related to rabies infection. Pt is in agreement and stable  for discharge.   This note was prepared using Dragon voice recognition software and may include unintentional dictation errors due to the inherent limitations of voice recognition software.  Final Clinical Impression(s) / ED Diagnoses Final diagnoses:  Dog bite, initial encounter    Rx / DC Orders ED Discharge Orders         Ordered    amoxicillin-clavulanate (AUGMENTIN) 875-125 MG tablet  Every 12 hours        07/15/20 1016           Discharge Instructions     Please pick up antibiotics and take as prescribed Keep wound clean and dry Return to the ED for rabies injections if you are contacted by animal control given they are currently watching your dog for any signs of rabies We have updated your tetanus shot in the ED and you are currently up to date for the next 10 years  Return to the ED IMMEDIATELY for any worsening symptoms including worsening swelling to your fingers, inability to bend or extend your fingers, redness/drainage of pus from your wounds, fevers > 100.4, chills, or any other new/concerning symptoms        Tanda Rockers, PA-C 07/15/20 1037    Tegeler, Canary Brim, MD 07/15/20 [phone]

## 2020-07-15 NOTE — Discharge Instructions (Addendum)
Please pick up antibiotics and take as prescribed Keep wound clean and dry Return to the ED for rabies injections if you are contacted by animal control given they are currently watching your dog for any signs of rabies We have updated your tetanus shot in the ED and you are currently up to date for the next 10 years  Return to the ED IMMEDIATELY for any worsening symptoms including worsening swelling to your fingers, inability to bend or extend your fingers, redness/drainage of pus from your wounds, fevers > 100.4, chills, or any other new/concerning symptoms

## 2020-07-15 NOTE — ED Triage Notes (Signed)
Pt reports being bitten by his dog last night on R fourth and fifth digits. Still some oozing from the wound.

## 2020-11-23 ENCOUNTER — Emergency Department (HOSPITAL_COMMUNITY): Payer: Self-pay

## 2020-11-23 ENCOUNTER — Emergency Department (HOSPITAL_COMMUNITY)
Admission: EM | Admit: 2020-11-23 | Discharge: 2020-11-23 | Disposition: A | Discharge status: Home / Self Care | Payer: Self-pay | Attending: Emergency Medicine | Admitting: Emergency Medicine

## 2020-11-23 DIAGNOSIS — W2209XA Striking against other stationary object, initial encounter: Secondary | ICD-10-CM | POA: Insufficient documentation

## 2020-11-23 DIAGNOSIS — F17210 Nicotine dependence, cigarettes, uncomplicated: Secondary | ICD-10-CM | POA: Insufficient documentation

## 2020-11-23 DIAGNOSIS — S62326A Displaced fracture of shaft of fifth metacarpal bone, right hand, initial encounter for closed fracture: Secondary | ICD-10-CM | POA: Insufficient documentation

## 2020-11-23 DIAGNOSIS — R030 Elevated blood-pressure reading, without diagnosis of hypertension: Secondary | ICD-10-CM | POA: Insufficient documentation

## 2020-11-23 NOTE — Progress Notes (Signed)
Orthopedic Tech Progress Note Patient Details:  Wayne Figueroa 1980/10/22 [id number]  Ortho Devices Type of Ortho Device: Ulna gutter splint Ortho Device/Splint Location: RUE Ortho Device/Splint Interventions: Application,Ordered   Post Interventions Patient Tolerated: Well Instructions Provided: Care of device   Donald Pore 11/23/2020, 12:02 PM

## 2020-11-23 NOTE — ED Triage Notes (Signed)
Pt arrives to ED with chief complaint of right hand injury after punching his refrigerator on Saturday. Right hand is swollen with deceased movement into fingers. Radial pulses equal and intact. Sensation to right fingers intact.

## 2020-11-23 NOTE — ED Notes (Signed)
Patient transported to X-ray 

## 2020-11-23 NOTE — Discharge Instructions (Signed)
At this time there does not appear to be the presence of an emergent medical condition, however there is always the potential for conditions to change. Please read and follow the below instructions.  Please return to the Emergency Department immediately for any new or worsening symptoms. Please be sure to follow up with your Primary Care Provider within one week regarding your visit today; please call their office to schedule an appointment even if you are feeling better for a follow-up visit. Call the hand surgeon Dr. Roney Mans today to schedule follow-up appointment for further assessment and treatment of your hand fracture.  You may need surgery for repair of your broken bone.  Call Dr. Hinda Glatter office today Please use rest ice elevation to help with your pain and swelling.  Please use over-the-counter Tylenol and ibuprofen as directed on packaging to help with your symptoms. Your blood pressure was elevated in the emergency department today.  Please have her rechecked by her primary care provider within 1 week.  Go to the nearest Emergency Department immediately if: You have fever or chills Your skin or nails on your injured hand turn blue or gray even after you loosen your splint. Your injured hand feels cold or becomes numb even after you loosen your splint. Get a very bad headache. Start to feel mixed up (confused). Feel weak or numb. Feel faint. Have very bad pain in your:  Chest.  Belly (abdomen). Throw up more than once. Have trouble breathing. You have any new/concerning or worsening of symptoms   Please read the additional information packets attached to your discharge summary.  Do not take your medicine if  develop an itchy rash, swelling in your mouth or lips, or difficulty breathing; call 911 and seek immediate emergency medical attention if this occurs.  You may review your lab tests and imaging results in their entirety on your MyChart account.  Please discuss all  results of fully with your primary care provider and other specialist at your follow-up visit.  Note: Portions of this text may have been transcribed using voice recognition software. Every effort was made to ensure accuracy; however, inadvertent computerized transcription errors may still be present.

## 2020-11-23 NOTE — ED Provider Notes (Signed)
[facility] EMERGENCY DEPARTMENT Provider Note   CSN: [id number] Arrival date & time: 11/23/20  1829     History Chief Complaint  Patient presents with  . Hand Injury    Wayne Figueroa is a 40 y.o. male otherwise healthy no daily medication use.  Patient reports that he got an argument on Saturday night, 2 days ago.  He was very angry and punched his refrigerator suffering immediate right hand pain which is been constant throbbing nonradiating worsened with movement and palpation no alleviating factors.  The next day he noticed swelling along the ulnar side of his right hand.  He reports pain is improved with ibuprofen and Tylenol.  Denies head injury, loss of consciousness, neck pain, back pain, chest pain, numbness/weakness, tingling, wrist pain, elbow pain, shoulder pain or any additional concerns.  HPI     No past medical history on file.  There are no problems to display for this patient.   No past surgical history on file.     No family history on file.  Social History   Tobacco Use  . Smoking status: Current Every Day Smoker    Packs/day: 0.33    Types: Cigarettes  . Smokeless tobacco: Never Used  Vaping Use  . Vaping Use: Never used  Substance Use Topics  . Alcohol use: Yes    Comment: socially  . Drug use: Never    Home Medications Prior to Admission medications   Medication Sig Start Date End Date Taking? Authorizing Provider  amoxicillin-clavulanate (AUGMENTIN) 875-125 MG tablet Take 1 tablet by mouth every 12 (twelve) hours. 07/15/20   Hyman Hopes, Margaux, PA-C  clindamycin (CLEOCIN) 300 MG capsule Take 1 capsule (300 mg total) by mouth 4 (four) times daily. X 7 days 10/17/19   Nicanor Alcon, April, MD  methocarbamol (ROBAXIN) 500 MG tablet Take 1 tablet (500 mg total) by mouth 2 (two) times daily. Patient not taking: Reported on 05/30/2018 05/23/18   Dietrich Pates, PA-C  oxyCODONE-acetaminophen (PERCOCET/ROXICET) 5-325 MG tablet Take 1  tablet by mouth every 6 (six) hours as needed for severe pain. 11/05/18   Hedges, Tinnie Gens, PA-C  predniSONE (STERAPRED UNI-PAK 21 TAB) 10 MG (21) TBPK tablet Take by mouth daily. Take 6 tabs by mouth daily  for 2 days, then 5 tabs for 2 days, then 4 tabs for 2 days, then 3 tabs for 2 days, 2 tabs for 2 days, then 1 tab by mouth daily for 2 days Patient not taking: Reported on 05/30/2018 05/23/18   Dietrich Pates, PA-C    Allergies    Patient has no known allergies.  Review of Systems   Review of Systems  Constitutional: Negative.  Negative for chills and fever.  Cardiovascular: Negative.  Negative for chest pain.  Gastrointestinal: Negative.  Negative for abdominal pain.  Musculoskeletal: Positive for arthralgias (Right hand) and joint swelling. Negative for back pain and neck pain.  Neurological: Negative.  Negative for weakness and numbness.    Physical Exam Updated Vital Signs BP (!) 161/101 (BP Location: Right Arm)   Pulse 76   Temp 98.5 F (36.9 C)   Resp 16   SpO2 100%   Physical Exam Constitutional:      General: He is not in acute distress.    Appearance: Normal appearance. He is well-developed. He is not ill-appearing or diaphoretic.  HENT:     Head: Normocephalic and atraumatic.  Eyes:     General: Vision grossly intact. Gaze aligned appropriately.  Pupils: Pupils are equal, round, and reactive to light.  Neck:     Trachea: Trachea and phonation normal.  Pulmonary:     Effort: Pulmonary effort is normal. No respiratory distress.  Abdominal:     General: There is no distension.     Palpations: Abdomen is soft.     Tenderness: There is no abdominal tenderness. There is no guarding or rebound.  Musculoskeletal:        General: Normal range of motion.     Cervical back: Normal range of motion.     Comments: Right hand: Swelling along the fourth and fifth metacarpals, no skin break.  Tenderness to palpation at the head of the fifth metacarpal.  Otherwise hand and  fingers appear normal.  No other tenderness about the hand.  Specifically no snuffbox tenderness to palpation.  Flexion and extension is intact at all joints with all fingers, so wrist pain with movements of the fourth and fifth fingers.  Capillary refill and and sensation intact to all fingers. Radial artery 2+ with <2sec cap refill in all fingers.  Sensation intact to light-tough in median/ulnar/radial distributions.  Skin:    General: Skin is warm and dry.  Neurological:     Mental Status: He is alert.     GCS: GCS eye subscore is 4. GCS verbal subscore is 5. GCS motor subscore is 6.     Comments: Speech is clear and goal oriented, follows commands Major Cranial nerves without deficit, no facial droop Moves extremities without ataxia, coordination intact  Psychiatric:        Behavior: Behavior normal.     ED Results / Procedures / Treatments   Labs (all labs ordered are listed, but only abnormal results are displayed) Labs Reviewed - No data to display  EKG None  Radiology DG Hand Complete Right  Result Date: 11/23/2020 CLINICAL DATA:  40 year old male status post punched refrigerator 2 days ago with pain. EXAM: RIGHT HAND - COMPLETE 3+ VIEW COMPARISON:  Right hand series 07/15/2020. FINDINGS: Oblique and mildly comminuted fracture of the distal shaft right 5th metacarpal. Moderate volar angulation. Mild radial displacement and angulation. Regional soft tissue swelling. Fifth MCP joint appears to remain normal. Underlying normal bone mineralization. Other osseous structures appear intact. IMPRESSION: Fracture of the distal shaft right 5th metacarpal with volar angulation and mild displacement. Electronically Signed   By: Odessa Fleming M.D.   On: 11/23/2020 10:49    Procedures Procedures   Medications Ordered in ED Medications - No data to display  ED Course  I have reviewed the triage vital signs and the nursing notes.  Pertinent labs & imaging results that were available during my  care of the patient were reviewed by me and considered in my medical decision making (see chart for details).  Clinical Course as of 11/23/20 1218  Mon Nov 23, 2020  1039 Ulnar Gutter, Dr. Janett Billow [BM]    Clinical Course User Index [BM] Elizabeth Palau   MDM Rules/Calculators/A&P                         Additional history obtained from: 1. Nursing notes from this visit. ---------------- 40 year old male presented with a right hand injury after punching his refrigerator 2 nights ago.  He has swelling and tenderness along the ulnar portion of the right hand.  There are no skin breaks.  I have personally reviewed patient's x-ray and agree with radiologist interpretation.  Patient has a  closed right fifth metacarpal fracture.  He is neurovascularly intact distally.  No evidence for septic arthritis, DVT, compartment syndrome, cellulitis, neurovascular compromise or other emergent pathologies.  Consult was called to orthopedic Earney Hamburg, PA-C who advised ulnar gutter splint and follow-up with hand specialist Dr. Roney Mans. - Patient reassessed he is resting comfortably no acute distress reports ulnar gutter splint is comfortable.  He is neurovascular intact post splint application.  He states understanding to call Dr. Hinda Glatter office today to schedule a follow-up appointment  Patient's blood pressure noted to be elevated in the ER, suspect this is secondary to pain/fracture.  Patient made aware of elevated blood pressure reading and to follow-up with PCP for recheck.  Patient informed signs/symptoms hypertensive urgency/emergency and to return to the ER for occur.  Patient was offered narcotic pain medication for his acute fracture he declined and wishes to continue only with OTC anti-inflammatories.  At this time there does not appear to be any evidence of an acute emergency medical condition and the patient appears stable for discharge with appropriate outpatient follow up.  Diagnosis was discussed with patient who verbalizes understanding of care plan and is agreeable to discharge. I have discussed return precautions with patient  who verbalizes understanding. Patient encouraged to follow-up with their PCP and hand. All questions answered.  Patient's case discussed with Dr. Rosalia Hammers who agrees with plan to discharge with follow-up.   Note: Portions of this report may have been transcribed using voice recognition software. Every effort was made to ensure accuracy; however, inadvertent computerized transcription errors may still be present. Final Clinical Impression(s) / ED Diagnoses Final diagnoses:  Closed displaced fracture of shaft of fifth metacarpal bone of right hand, initial encounter  Elevated blood pressure reading    Rx / DC Orders ED Discharge Orders    None       Elizabeth Palau 11/23/20 1218    Margarita Grizzle, MD 11/24/20 [phone]

## 2021-09-20 ENCOUNTER — Emergency Department (HOSPITAL_COMMUNITY)
Admission: EM | Admit: 2021-09-20 | Discharge: 2021-09-20 | Disposition: A | Discharge status: Home / Self Care | Payer: Self-pay | Attending: Emergency Medicine | Admitting: Emergency Medicine

## 2021-09-20 ENCOUNTER — Encounter (HOSPITAL_COMMUNITY): Payer: Self-pay | Admitting: Emergency Medicine

## 2021-09-20 DIAGNOSIS — K098 Other cysts of oral region, not elsewhere classified: Secondary | ICD-10-CM | POA: Insufficient documentation

## 2021-09-20 DIAGNOSIS — L723 Sebaceous cyst: Secondary | ICD-10-CM

## 2021-09-20 MED ORDER — DOXYCYCLINE HYCLATE 100 MG PO CAPS: 100.0000 mg | ORAL_CAPSULE | Freq: Two times a day (BID) | ORAL | 0 refills | Status: DC

## 2021-09-20 NOTE — Discharge Instructions (Addendum)
Contact a health care provider if: Your cyst develops symptoms of infection. Your condition is not improving or is getting worse. You develop a cyst that looks different from other cysts you have had. You have a fever. Get help right away if: Redness spreads from the cyst into the surrounding area. 

## 2021-09-20 NOTE — ED Triage Notes (Signed)
Patient here for recurrent abscess on right side of face, states he has had it treated twice previously, once is antibiotics and once it was opened and drained. Patient alert, oriented,afebrile,and in no apparent distress at this time.

## 2021-09-20 NOTE — ED Provider Notes (Signed)
[facility] EMERGENCY DEPARTMENT Provider Note   CSN: [id number] Arrival date & time: 09/20/21  9024     History  Chief Complaint  Patient presents with   Abscess    Wayne Figueroa is a 41 y.o. male who presents emergency department with a chief complaint of abscess.  He has a history of firm cystic structure to the right of his mouth.  In the past it has gotten swollen but went away.  Over the past couple days it has gotten increasingly tender and swollen.  Is happened previously.  He came to the emergency department for further evaluation.  He denies fevers chills difficulty swallowing   Abscess     Home Medications Prior to Admission medications   Medication Sig Start Date End Date Taking? Authorizing Provider  amoxicillin-clavulanate (AUGMENTIN) 875-125 MG tablet Take 1 tablet by mouth every 12 (twelve) hours. 07/15/20   Hyman Hopes, Margaux, PA-C  clindamycin (CLEOCIN) 300 MG capsule Take 1 capsule (300 mg total) by mouth 4 (four) times daily. X 7 days 10/17/19   Nicanor Alcon, April, MD  methocarbamol (ROBAXIN) 500 MG tablet Take 1 tablet (500 mg total) by mouth 2 (two) times daily. Patient not taking: Reported on 05/30/2018 05/23/18   Dietrich Pates, PA-C  oxyCODONE-acetaminophen (PERCOCET/ROXICET) 5-325 MG tablet Take 1 tablet by mouth every 6 (six) hours as needed for severe pain. 11/05/18   Hedges, Tinnie Gens, PA-C  predniSONE (STERAPRED UNI-PAK 21 TAB) 10 MG (21) TBPK tablet Take by mouth daily. Take 6 tabs by mouth daily  for 2 days, then 5 tabs for 2 days, then 4 tabs for 2 days, then 3 tabs for 2 days, 2 tabs for 2 days, then 1 tab by mouth daily for 2 days Patient not taking: Reported on 05/30/2018 05/23/18   Dietrich Pates, PA-C      Allergies    Patient has no known allergies.    Review of Systems   Review of Systems As per the HPI Physical Exam Updated Vital Signs BP 128/73 (BP Location: Left Arm)    Pulse 73    Temp 97.7 F (36.5 C)    Resp 17    SpO2  100%  Physical Exam Vitals and nursing note reviewed.  Constitutional:      General: He is not in acute distress.    Appearance: He is well-developed. He is not diaphoretic.  HENT:     Head: Normocephalic and atraumatic.     Comments: This well-circumscribed mobile cystic structure consistent with sebaceous or epidermoid cyst of the face just parallel to the angle of the mouth on the right side.  There are some mild erythema without fluctuance. Eyes:     General: No scleral icterus.    Conjunctiva/sclera: Conjunctivae normal.  Cardiovascular:     Rate and Rhythm: Normal rate and regular rhythm.     Heart sounds: Normal heart sounds.  Pulmonary:     Effort: Pulmonary effort is normal. No respiratory distress.     Breath sounds: Normal breath sounds.  Abdominal:     Palpations: Abdomen is soft.     Tenderness: There is no abdominal tenderness.  Musculoskeletal:     Cervical back: Normal range of motion and neck supple.  Skin:    General: Skin is warm and dry.  Neurological:     Mental Status: He is alert.  Psychiatric:        Behavior: Behavior normal.    ED Results / Procedures / Treatments   Labs (  all labs ordered are listed, but only abnormal results are displayed) Labs Reviewed - No data to display  EKG None  Radiology No results found.  Procedures Procedures   Medications Ordered in ED Medications - No data to display  ED Course/ Medical Decision Making/ A&P                           Medical Decision Making Patient with inflamed epidermoid cyst.  No fluctuance.  In shared decision making with the patient I have offered that he may wait for incision and drainage and or he may be discharged now with antibiotics and surgical follow-up.  Patient states that he would like to be discharged now and follow-up with surgery so he can have an excision.  I discussed return precautions and outpatient follow-up  Final Clinical Impression(s) / ED Diagnoses Final diagnoses:   None    Rx / DC Orders ED Discharge Orders     None         Arthor Captain, PA-C 09/20/21 1027    Mancel Bale, MD 09/20/21 1951

## 2021-12-09 ENCOUNTER — Observation Stay (HOSPITAL_COMMUNITY)
Admission: EM | Admit: 2021-12-09 | Discharge: 2021-12-10 | Disposition: A | Discharge status: Home / Self Care | Payer: Self-pay | Attending: Internal Medicine | Admitting: Internal Medicine

## 2021-12-09 ENCOUNTER — Other Ambulatory Visit: Payer: Self-pay

## 2021-12-09 ENCOUNTER — Emergency Department (HOSPITAL_COMMUNITY): Payer: Self-pay

## 2021-12-09 DIAGNOSIS — K2900 Acute gastritis without bleeding: Secondary | ICD-10-CM

## 2021-12-09 DIAGNOSIS — K3189 Other diseases of stomach and duodenum: Secondary | ICD-10-CM | POA: Insufficient documentation

## 2021-12-09 DIAGNOSIS — R1013 Epigastric pain: Secondary | ICD-10-CM

## 2021-12-09 DIAGNOSIS — F17210 Nicotine dependence, cigarettes, uncomplicated: Secondary | ICD-10-CM | POA: Insufficient documentation

## 2021-12-09 DIAGNOSIS — R933 Abnormal findings on diagnostic imaging of other parts of digestive tract: Secondary | ICD-10-CM

## 2021-12-09 DIAGNOSIS — I700 Atherosclerosis of aorta: Secondary | ICD-10-CM | POA: Insufficient documentation

## 2021-12-09 DIAGNOSIS — K259 Gastric ulcer, unspecified as acute or chronic, without hemorrhage or perforation: Secondary | ICD-10-CM | POA: Diagnosis present

## 2021-12-09 DIAGNOSIS — K2950 Unspecified chronic gastritis without bleeding: Principal | ICD-10-CM | POA: Insufficient documentation

## 2021-12-09 DIAGNOSIS — Z20822 Contact with and (suspected) exposure to covid-19: Secondary | ICD-10-CM | POA: Insufficient documentation

## 2021-12-09 DIAGNOSIS — B9681 Helicobacter pylori [H. pylori] as the cause of diseases classified elsewhere: Secondary | ICD-10-CM | POA: Insufficient documentation

## 2021-12-09 DIAGNOSIS — N400 Enlarged prostate without lower urinary tract symptoms: Secondary | ICD-10-CM | POA: Insufficient documentation

## 2021-12-09 LAB — COMPREHENSIVE METABOLIC PANEL
ALT: 35 U/L (ref 0–44)
AST: 40 U/L (ref 15–41)
Albumin: 3.5 g/dL (ref 3.5–5.0)
Alkaline Phosphatase: 56 U/L (ref 38–126)
Anion gap: 9 (ref 5–15)
BUN: 9 mg/dL (ref 6–20)
CO2: 24 mmol/L (ref 22–32)
Calcium: 9.4 mg/dL (ref 8.9–10.3)
Chloride: 104 mmol/L (ref 98–111)
Creatinine, Ser: 1.23 mg/dL (ref 0.61–1.24)
GFR, Estimated: >60 mL/min (ref >60)
Glucose, Bld: 106 mg/dL — ABNORMAL HIGH (ref 70–99)
Potassium: 3.7 mmol/L (ref 3.5–5.1)
Sodium: 137 mmol/L (ref 135–145)
Total Bilirubin: 0.9 mg/dL (ref 0.3–1.2)
Total Protein: 6.1 g/dL — ABNORMAL LOW (ref 6.5–8.1)

## 2021-12-09 LAB — RESP PANEL BY RT-PCR (FLU A&B, COVID) ARPGX2
Influenza A by PCR: NEGATIVE
Influenza B by PCR: NEGATIVE
SARS Coronavirus 2 by RT PCR: NEGATIVE

## 2021-12-09 LAB — URINALYSIS, ROUTINE W REFLEX MICROSCOPIC
Bacteria, UA: NONE SEEN
Bilirubin Urine: NEGATIVE
Glucose, UA: NEGATIVE mg/dL
Hgb urine dipstick: NEGATIVE
Ketones, ur: 20 mg/dL — AB
Nitrite: NEGATIVE
Protein, ur: NEGATIVE mg/dL
Specific Gravity, Urine: 1.028 (ref 1.005–1.030)
pH: 6 (ref 5.0–8.0)

## 2021-12-09 LAB — CBC
HCT: 44.1 % (ref 39.0–52.0)
Hemoglobin: 15.1 g/dL (ref 13.0–17.0)
MCH: 36.5 pg — ABNORMAL HIGH (ref 26.0–34.0)
MCHC: 34.2 g/dL (ref 30.0–36.0)
MCV: 106.5 fL — ABNORMAL HIGH (ref 80.0–100.0)
Platelets: 343 10*3/uL (ref 150–400)
RBC: 4.14 MIL/uL — ABNORMAL LOW (ref 4.22–5.81)
RDW: 11.9 % (ref 11.5–15.5)
WBC: 7.7 10*3/uL (ref 4.0–10.5)
nRBC: 0 % (ref 0.0–0.2)

## 2021-12-09 LAB — LIPASE, BLOOD: Lipase: 29 U/L (ref 11–51)

## 2021-12-09 MED ORDER — IOHEXOL 300 MG/ML  SOLN
100.0000 mL | Freq: Once | INTRAMUSCULAR | Status: AC | PRN
  Administered 2021-12-09: 100 mL via INTRAVENOUS

## 2021-12-09 MED ORDER — FENTANYL CITRATE PF 50 MCG/ML IJ SOSY
50.0000 ug | PREFILLED_SYRINGE | Freq: Once | INTRAMUSCULAR | Status: AC
  Administered 2021-12-09: 50 ug via INTRAVENOUS
  Filled 2021-12-09: qty 1

## 2021-12-09 MED ORDER — LIDOCAINE VISCOUS HCL 2 % MT SOLN
15.0000 mL | Freq: Once | OROMUCOSAL | Status: AC
  Administered 2021-12-09: 15 mL via ORAL
  Filled 2021-12-09: qty 15

## 2021-12-09 MED ORDER — PANTOPRAZOLE SODIUM 40 MG IV SOLR
40.0000 mg | Freq: Two times a day (BID) | INTRAVENOUS | Status: DC
  Administered 2021-12-09 – 2021-12-10 (×2): 40 mg via INTRAVENOUS
  Filled 2021-12-09 (×2): qty 10

## 2021-12-09 MED ORDER — SODIUM CHLORIDE 0.9 % IV BOLUS
1000.0000 mL | Freq: Once | INTRAVENOUS | Status: AC
  Administered 2021-12-09: 1000 mL via INTRAVENOUS

## 2021-12-09 MED ORDER — HYDROCODONE-ACETAMINOPHEN 5-325 MG PO TABS
1.0000 | ORAL_TABLET | Freq: Four times a day (QID) | ORAL | Status: DC | PRN
Start: 2021-12-09 — End: 2021-12-10
  Administered 2021-12-09: 1 via ORAL
  Filled 2021-12-09: qty 1

## 2021-12-09 MED ORDER — PANTOPRAZOLE SODIUM 40 MG IV SOLR
40.0000 mg | Freq: Once | INTRAVENOUS | Status: AC
  Administered 2021-12-09: 40 mg via INTRAVENOUS
  Filled 2021-12-09: qty 10

## 2021-12-09 MED ORDER — ALUM & MAG HYDROXIDE-SIMETH 200-200-20 MG/5ML PO SUSP
30.0000 mL | Freq: Once | ORAL | Status: AC
  Administered 2021-12-09: 30 mL via ORAL
  Filled 2021-12-09: qty 30

## 2021-12-09 NOTE — H&P (View-Only) (Signed)
? ? ? Consultation ? ?Referring Provider:    Dr. Belfi ?Primary Care Physician:  Patient, No Pcp Per (Inactive) ?Primary Gastroenterologist: Unassigned        ?Reason for Consultation: Abdominal pain and abnormal CT of the stomach    ?       ? HPI:   ?Wayne Figueroa is a 41 y.o. male with no significant past medical history, who presented to the ED today with a complaint of left-sided abdominal pain.  We are consulted now in regards to an abnormal CT of the stomach. ?   Today, the patient describes that he started with epigastric and left-sided abdominal pain on 12/06/2021 which came on gradually and worsened over that day and then has remained constant typically a 5-6/10 which feels like something is "squeezing me on the inside".  Tells me that this will seem to come and go on its own, but is enough that he cannot sleep very well at night.  He has continued to work but has had a decrease in appetite.  He has been taking Tums and Pepto every couple of hours with mild relief.  He has also been using Aleve since Monday 2-3 times a day.  No vomiting or diarrhea.  No previous episodes of similar symptoms.  No history of reflux.  ?   Does admit to drinking alcohol 3 to 4 days a week at least 2 beers and sometimes a couple shots of liquor as well as being a daily smoker for the past 20 years or so. ?   Denies fever, chills, weight loss, melena or bright red blood in his stool. ? ?ER course: CT abdomen pelvis with contrast shows signs of extensive gastric antral inflammation and areas of focal ulceration without current secondary signs of perforation, generalized thickening of the stomach which more likely reflects diffuse gastritis in the setting of ulcer disease, recommending endoscopic follow-up to exclude underlying neoplasm, also abnormal enhancing focus at the bladder base with recommendations for follow-up with urology and trace free fluid in the pelvis; labs with an MCV elevated at 106.5, normal hemoglobin,  glucose 106, total protein 6.1 and otherwise normal CMP, lipase normal ? ?GI History: ?None ? ?Past medical/surgical history: ?None ? ?Family history: ?No GI cancers ? ?Social History  ? ?Tobacco Use  ? Smoking status: Every Day  ?  Packs/day: 0.33  ?  Types: Cigarettes  ? Smokeless tobacco: Never  ?Vaping Use  ? Vaping Use: Never used  ?Substance Use Topics  ? Alcohol use: Yes  ?  Comment: socially  ? Drug use: Never  ? ? ?Prior to Admission medications   ?Medication Sig Start Date End Date Taking? Authorizing Provider  ?doxycycline (VIBRAMYCIN) 100 MG capsule Take 1 capsule (100 mg total) by mouth 2 (two) times daily. One po bid x 7 days ?Patient not taking: Reported on 12/09/2021 09/20/21   Harris, Abigail, PA-C  ? ? ?No current facility-administered medications for this encounter.  ? ?Current Outpatient Medications  ?Medication Sig Dispense Refill  ? doxycycline (VIBRAMYCIN) 100 MG capsule Take 1 capsule (100 mg total) by mouth 2 (two) times daily. One po bid x 7 days (Patient not taking: Reported on 12/09/2021) 14 capsule 0  ? ? ?Allergies as of 12/09/2021  ? (No Known Allergies)  ? ? ? ?Review of Systems:    ?Constitutional: No weight loss, fever or chills ?Skin: No rash  ?Cardiovascular: No chest pain  ?Respiratory: No SOB ?Gastrointestinal: See HPI and otherwise negative ?Genitourinary:   No dysuria  ?Neurological: No headache, dizziness or syncope ?Musculoskeletal: No new muscle or joint pain ?Hematologic: No bleeding  ?Psychiatric: No history of depression or anxiety  ? ? Physical Exam:  ?Vital signs in last 24 hours: ?Temp:  [98.7 ?F (37.1 ?C)] 98.7 ?F (37.1 ?C) (04/06 [id number]) ?Pulse Rate:  [44-65] 65 (04/06 1135) ?Resp:  [15-16] 15 (04/06 1135) ?BP: (131-142)/(86-100) 131/86 (04/06 1135) ?SpO2:  [100 %] 100 % (04/06 1135) ?Weight:  [77.1 kg] 77.1 kg (04/06 0844) ?  ?General:   Pleasant African-American male appears to be in NAD, Well developed, Well nourished, alert and cooperative ?Head:  Normocephalic and  atraumatic. ?Eyes:   PEERL, EOMI. No icterus. Conjunctiva pink. ?Ears:  Normal auditory acuity. ?Neck:  Supple ?Throat: Oral cavity and pharynx without inflammation, swelling or lesion.  ?Lungs: Respirations even and unlabored. Lungs clear to auscultation bilaterally.   No wheezes, crackles, or rhonchi.  ?Heart: Normal S1, S2. No MRG. Regular rate and rhythm. No peripheral edema, cyanosis or pallor.  ?Abdomen:  Soft, nondistended, moderate epigastric and left upper quadrant TTP with some involuntary guarding, normal bowel sounds. No appreciable masses or hepatomegaly. ?Rectal:  Not performed.  ?Msk:  Symmetrical without gross deformities. Peripheral pulses intact.  ?Extremities:  Without edema, no deformity or joint abnormality.  ?Neurologic:  Alert and  oriented x4;  grossly normal neurologically.  ?Skin:   Dry and intact without significant lesions or rashes.+ Tattoos ?Psychiatric: Demonstrates good judgement and reason without abnormal affect or behaviors. ? ? ?LAB RESULTS: ?Recent Labs  ?  12/09/21 ?[id number]  ?WBC 7.7  ?HGB 15.1  ?HCT 44.1  ?PLT 343  ? ?BMET ?Recent Labs  ?  12/09/21 ?0855  ?NA 137  ?K 3.7  ?CL 104  ?CO2 24  ?GLUCOSE 106*  ?BUN 9  ?CREATININE 1.23  ?CALCIUM 9.4  ? ?LFT ?Recent Labs  ?  12/09/21 ?0855  ?PROT 6.1*  ?ALBUMIN 3.5  ?AST 40  ?ALT 35  ?ALKPHOS 56  ?BILITOT 0.9  ? ?STUDIES: ?CT ABDOMEN PELVIS W CONTRAST ? ?Result Date: 12/09/2021 ?CLINICAL DATA:  41 year old male presents for evaluation of LEFT upper quadrant abdominal pain. EXAM: CT ABDOMEN AND PELVIS WITH CONTRAST TECHNIQUE: Multidetector CT imaging of the abdomen and pelvis was performed using the standard protocol following bolus administration of intravenous contrast. RADIATION DOSE REDUCTION: This exam was performed according to the departmental dose-optimization program which includes automated exposure control, adjustment of the mA and/or kV according to patient size and/or use of iterative reconstruction technique. CONTRAST:  165mL  OMNIPAQUE IOHEXOL 300 MG/ML  SOLN COMPARISON:  None FINDINGS: Lower chest: Incidental imaging of the lung bases is unremarkable. Hepatobiliary: No focal, suspicious hepatic lesion. No pericholecystic stranding. No biliary duct dilation. Portal vein is patent. Pancreas: Normal, without mass, inflammation or ductal dilatation. Spleen: Spleen is small but otherwise normal without focal lesion. Adrenals/Urinary Tract: Adrenal glands are unremarkable. Symmetric renal enhancement. No sign of hydronephrosis. No suspicious renal lesion or perinephric stranding. Urinary bladder with limited distension. Enhancing focus at the bladder base may arise from the prostate though there is mild prostate heterogeneity and enlargement less than would be expected for hypertrophied median lobe with extension into the bladder base. Stomach/Bowel: Signs of gastric antral thickening and generalized gastric thickening. Edema and stranding about the gastric antrum. Loss of normal rugal folds along the lesser curvature with broad-based appearance and a more focal protrusion along the posterior aspect of the stomach. The more central focal area measuring 9 mm. Loss of normal  gastric folds along approximally 50-70% of the circumference as viewed in the sagittal plane. Mucosal irregularity also tracks distally into the gastric antrum to towards the pylorus. No substantial duodenal thickening. No sign of small bowel obstruction. No pericolonic stranding.  The appendix is normal. Vascular/Lymphatic: Aortic atherosclerosis. No sign of aneurysm. Smooth contour of the IVC. There is no gastrohepatic or hepatoduodenal ligament lymphadenopathy. No retroperitoneal or mesenteric lymphadenopathy. No pelvic sidewall lymphadenopathy. Reproductive: Heterogeneous prostate. Suspected median lobe extension into bladder base though size of prostate and location in the trigone is nonspecific making it difficult to exclude a small bladder lesion. Other: No  pneumoperitoneum.  Trace free fluid in the pelvis. Musculoskeletal: No acute bone finding. No destructive bone process. Spinal degenerative changes. Degenerative changes are mild. IMPRESSION: 1. Signs of extensive g

## 2021-12-09 NOTE — ED Provider Notes (Signed)
?[facility] EMERGENCY DEPARTMENT ?Provider Note ? ? ?CSN: [id number] ?Arrival date & time: 12/09/21  6803 ? ?  ? ?History ? ?Chief Complaint  ?Patient presents with  ? Abdominal Pain  ? Otalgia  ? ? ?Wayne Figueroa is a 41 y.o. male presents for abdominal pain and R ear pain.  ?He c/o pain in the Epigastrium and left side of the abdomen onset 4 days ago. He has had associated nausea and he has been taking tums and pepto q2h with mild Relief. No n/v/d or fevers.  Patient does drink alcohol nightly.  He states at least 2 beers and sometimes couple shots of liquor.  He is a daily smoker. ? ? ? ?Abdominal Pain ?Otalgia ?Associated symptoms: abdominal pain   ? ?  ? ?Home Medications ?Prior to Admission medications   ?Medication Sig Start Date End Date Taking? Authorizing Provider  ?doxycycline (VIBRAMYCIN) 100 MG capsule Take 1 capsule (100 mg total) by mouth 2 (two) times daily. One po bid x 7 days 09/20/21   Arthor Captain, PA-C  ?methocarbamol (ROBAXIN) 500 MG tablet Take 1 tablet (500 mg total) by mouth 2 (two) times daily. ?Patient not taking: Reported on 05/30/2018 05/23/18   Dietrich Pates, PA-C  ?oxyCODONE-acetaminophen (PERCOCET/ROXICET) 5-325 MG tablet Take 1 tablet by mouth every 6 (six) hours as needed for severe pain. 11/05/18   Hedges, Tinnie Gens, PA-C  ?predniSONE (STERAPRED UNI-PAK 21 TAB) 10 MG (21) TBPK tablet Take by mouth daily. Take 6 tabs by mouth daily  for 2 days, then 5 tabs for 2 days, then 4 tabs for 2 days, then 3 tabs for 2 days, 2 tabs for 2 days, then 1 tab by mouth daily for 2 days ?Patient not taking: Reported on 05/30/2018 05/23/18   Dietrich Pates, PA-C  ?   ? ?Allergies    ?Patient has no known allergies.   ? ?Review of Systems   ?Review of Systems  ?HENT:  Positive for ear pain.   ?Gastrointestinal:  Positive for abdominal pain.  ? ?Physical Exam ?Updated Vital Signs ?BP (!) 142/100 (BP Location: Right Arm)   Pulse (!) 44   Temp 98.7 ?F (37.1 ?C) (Oral)   Resp 16   Ht  6\' 2"  (1.88 m)   Wt 77.1 kg   SpO2 100%   BMI 21.83 kg/m?  ?Physical Exam ?Vitals and nursing note reviewed.  ?Constitutional:   ?   General: He is not in acute distress. ?   Appearance: He is well-developed. He is not diaphoretic.  ?HENT:  ?   Head: Normocephalic and atraumatic.  ?Eyes:  ?   General: No scleral icterus. ?   Conjunctiva/sclera: Conjunctivae normal.  ?Cardiovascular:  ?   Rate and Rhythm: Normal rate and regular rhythm.  ?   Heart sounds: Normal heart sounds.  ?Pulmonary:  ?   Effort: Pulmonary effort is normal. No respiratory distress.  ?   Breath sounds: Normal breath sounds.  ?Abdominal:  ?   Palpations: Abdomen is soft.  ?   Tenderness: There is abdominal tenderness in the left upper quadrant and left lower quadrant.  ?Musculoskeletal:  ?   Cervical back: Normal range of motion and neck supple.  ?Skin: ?   General: Skin is warm and dry.  ?Neurological:  ?   Mental Status: He is alert.  ?Psychiatric:     ?   Behavior: Behavior normal.  ? ? ?ED Results / Procedures / Treatments   ?Labs ?(all labs ordered are listed,  but only abnormal results are displayed) ?Labs Reviewed  ?COMPREHENSIVE METABOLIC PANEL - Abnormal; Notable for the following components:  ?    Result Value  ? Glucose, Bld 106 (*)   ? Total Protein 6.1 (*)   ? All other components within normal limits  ?CBC - Abnormal; Notable for the following components:  ? RBC 4.14 (*)   ? MCV 106.5 (*)   ? MCH 36.5 (*)   ? All other components within normal limits  ?URINALYSIS, ROUTINE W REFLEX MICROSCOPIC - Abnormal; Notable for the following components:  ? Color, Urine AMBER (*)   ? Ketones, ur 20 (*)   ? Leukocytes,Ua TRACE (*)   ? All other components within normal limits  ?LIPASE, BLOOD  ? ? ?EKG ?None ? ?Radiology ?No results found. ? ?Procedures ?Procedures  ? ? ?Medications Ordered in ED ?Medications - No data to display ? ?ED Course/ Medical Decision Making/ A&P ?  ?                        ?Medical Decision Making ?Patient here with  abdominal pain x4 days.  Differential diagnosis includes gastritis, peptic ulcer disease, pancreatitis, diverticulitis, constipation.  Patient admits to daily drinking and also states that he takes 2 Aleve daily for headaches when he wakes up.  I ordered and reviewed both labs and imaging.  His imaging is highly concerning for severe gastritis.  Currently the patient is pain-free after IV Protonix, pain medications and a GI cocktail.  I discussed the case with PA Hyacinth Meeker of [facility] who will consult on the patient and has discussed the case with Dr. Adela Lank who recommends admission.  Patient will need EGD tomorrow.  I discussed findings with the patient.  I discussed the need for discontinuation of NSAID use and in particular patient should refrain from drinking alcohol.  He is hemodynamically stable without evidence of infection or perforated ulcer. ? ?Problems Addressed: ?Acute gastritis, presence of bleeding unspecified, unspecified gastritis type: complicated acute illness or injury ? ?Amount and/or Complexity of Data Reviewed ?Labs:  ?   Details: CBC with elevated MCV, otherwise hemoglobin is normal, no white count.  CMP shows slightly low protein with normal albumin level likely due to poor oral intake.  Lipase within normal limits ?Radiology: ordered and independent interpretation performed. Decision-making details documented in ED Course. ?   Details: I personally visualized CT of the abdomen pelvis which shows severe gastric antrum thickening, swelling and ulcerations.  Agree with radiologic interpretation ?Discussion of management or test interpretation with external provider(s): Case discussed with internal medicine residency service who will admit the patient for further work-up and evaluation. ? ?Risk ?OTC drugs. ?Prescription drug management. ?Decision regarding hospitalization. ? ? ? ?Final Clinical Impression(s) / ED Diagnoses ?Final diagnoses:  ?None  ? ? ?Rx / DC Orders ?ED Discharge  Orders   ? ? None  ? ?  ? ? ?  ?Arthor Captain, PA-C ?12/09/21 1539 ? ?  ?Rolan Bucco, MD ?12/10/21 [phone] ? ?

## 2021-12-09 NOTE — ED Triage Notes (Signed)
Pt. Stated, Wayne Figueroa had stomach pain and rt. Ear pain since Monday. Ive also had a headache. ?

## 2021-12-09 NOTE — Consult Note (Signed)
? ? ? Consultation ? ?Referring Provider:    Dr. Fredderick Phenix ?Primary Care Physician:  Patient, No Pcp Per (Inactive) ?Primary Gastroenterologist: Gentry Fitz        ?Reason for Consultation: Abdominal pain and abnormal CT of the stomach    ?       ? HPI:   ?Wayne Figueroa is a 41 y.o. male with no significant past medical history, who presented to the ED today with a complaint of left-sided abdominal pain.  We are consulted now in regards to an abnormal CT of the stomach. ?   Today, the patient describes that he started with epigastric and left-sided abdominal pain on 12/06/2021 which came on gradually and worsened over that day and then has remained constant typically a 5-6/10 which feels like something is "squeezing me on the inside".  Tells me that this will seem to come and go on its own, but is enough that he cannot sleep very well at night.  He has continued to work but has had a decrease in appetite.  He has been taking Tums and Pepto every couple of hours with mild relief.  He has also been using Aleve since Monday 2-3 times a day.  No vomiting or diarrhea.  No previous episodes of similar symptoms.  No history of reflux.  ?   Does admit to drinking alcohol 3 to 4 days a week at least 2 beers and sometimes a couple shots of liquor as well as being a daily smoker for the past 20 years or so. ?   Denies fever, chills, weight loss, melena or bright red blood in his stool. ? ?ER course: CT abdomen pelvis with contrast shows signs of extensive gastric antral inflammation and areas of focal ulceration without current secondary signs of perforation, generalized thickening of the stomach which more likely reflects diffuse gastritis in the setting of ulcer disease, recommending endoscopic follow-up to exclude underlying neoplasm, also abnormal enhancing focus at the bladder base with recommendations for follow-up with urology and trace free fluid in the pelvis; labs with an MCV elevated at 106.5, normal hemoglobin,  glucose 106, total protein 6.1 and otherwise normal CMP, lipase normal ? ?GI History: ?None ? ?Past medical/surgical history: ?None ? ?Family history: ?No GI cancers ? ?Social History  ? ?Tobacco Use  ? Smoking status: Every Day  ?  Packs/day: 0.33  ?  Types: Cigarettes  ? Smokeless tobacco: Never  ?Vaping Use  ? Vaping Use: Never used  ?Substance Use Topics  ? Alcohol use: Yes  ?  Comment: socially  ? Drug use: Never  ? ? ?Prior to Admission medications   ?Medication Sig Start Date End Date Taking? Authorizing Provider  ?doxycycline (VIBRAMYCIN) 100 MG capsule Take 1 capsule (100 mg total) by mouth 2 (two) times daily. One po bid x 7 days ?Patient not taking: Reported on 12/09/2021 09/20/21   Arthor Captain, PA-C  ? ? ?No current facility-administered medications for this encounter.  ? ?Current Outpatient Medications  ?Medication Sig Dispense Refill  ? doxycycline (VIBRAMYCIN) 100 MG capsule Take 1 capsule (100 mg total) by mouth 2 (two) times daily. One po bid x 7 days (Patient not taking: Reported on 12/09/2021) 14 capsule 0  ? ? ?Allergies as of 12/09/2021  ? (No Known Allergies)  ? ? ? ?Review of Systems:    ?Constitutional: No weight loss, fever or chills ?Skin: No rash  ?Cardiovascular: No chest pain  ?Respiratory: No SOB ?Gastrointestinal: See HPI and otherwise negative ?Genitourinary:  No dysuria  ?Neurological: No headache, dizziness or syncope ?Musculoskeletal: No new muscle or joint pain ?Hematologic: No bleeding  ?Psychiatric: No history of depression or anxiety  ? ? Physical Exam:  ?Vital signs in last 24 hours: ?Temp:  [98.7 ?F (37.1 ?C)] 98.7 ?F (37.1 ?C) (04/06 [id number]) ?Pulse Rate:  [44-65] 65 (04/06 1135) ?Resp:  [15-16] 15 (04/06 1135) ?BP: (131-142)/(86-100) 131/86 (04/06 1135) ?SpO2:  [100 %] 100 % (04/06 1135) ?Weight:  [77.1 kg] 77.1 kg (04/06 0844) ?  ?General:   Pleasant African-American male appears to be in NAD, Well developed, Well nourished, alert and cooperative ?Head:  Normocephalic and  atraumatic. ?Eyes:   PEERL, EOMI. No icterus. Conjunctiva pink. ?Ears:  Normal auditory acuity. ?Neck:  Supple ?Throat: Oral cavity and pharynx without inflammation, swelling or lesion.  ?Lungs: Respirations even and unlabored. Lungs clear to auscultation bilaterally.   No wheezes, crackles, or rhonchi.  ?Heart: Normal S1, S2. No MRG. Regular rate and rhythm. No peripheral edema, cyanosis or pallor.  ?Abdomen:  Soft, nondistended, moderate epigastric and left upper quadrant TTP with some involuntary guarding, normal bowel sounds. No appreciable masses or hepatomegaly. ?Rectal:  Not performed.  ?Msk:  Symmetrical without gross deformities. Peripheral pulses intact.  ?Extremities:  Without edema, no deformity or joint abnormality.  ?Neurologic:  Alert and  oriented x4;  grossly normal neurologically.  ?Skin:   Dry and intact without significant lesions or rashes.+ Tattoos ?Psychiatric: Demonstrates good judgement and reason without abnormal affect or behaviors. ? ? ?LAB RESULTS: ?Recent Labs  ?  12/09/21 ?[id number]  ?WBC 7.7  ?HGB 15.1  ?HCT 44.1  ?PLT 343  ? ?BMET ?Recent Labs  ?  12/09/21 ?0855  ?NA 137  ?K 3.7  ?CL 104  ?CO2 24  ?GLUCOSE 106*  ?BUN 9  ?CREATININE 1.23  ?CALCIUM 9.4  ? ?LFT ?Recent Labs  ?  12/09/21 ?0855  ?PROT 6.1*  ?ALBUMIN 3.5  ?AST 40  ?ALT 35  ?ALKPHOS 56  ?BILITOT 0.9  ? ?STUDIES: ?CT ABDOMEN PELVIS W CONTRAST ? ?Result Date: 12/09/2021 ?CLINICAL DATA:  41 year old male presents for evaluation of LEFT upper quadrant abdominal pain. EXAM: CT ABDOMEN AND PELVIS WITH CONTRAST TECHNIQUE: Multidetector CT imaging of the abdomen and pelvis was performed using the standard protocol following bolus administration of intravenous contrast. RADIATION DOSE REDUCTION: This exam was performed according to the departmental dose-optimization program which includes automated exposure control, adjustment of the mA and/or kV according to patient size and/or use of iterative reconstruction technique. CONTRAST:  165mL  OMNIPAQUE IOHEXOL 300 MG/ML  SOLN COMPARISON:  None FINDINGS: Lower chest: Incidental imaging of the lung bases is unremarkable. Hepatobiliary: No focal, suspicious hepatic lesion. No pericholecystic stranding. No biliary duct dilation. Portal vein is patent. Pancreas: Normal, without mass, inflammation or ductal dilatation. Spleen: Spleen is small but otherwise normal without focal lesion. Adrenals/Urinary Tract: Adrenal glands are unremarkable. Symmetric renal enhancement. No sign of hydronephrosis. No suspicious renal lesion or perinephric stranding. Urinary bladder with limited distension. Enhancing focus at the bladder base may arise from the prostate though there is mild prostate heterogeneity and enlargement less than would be expected for hypertrophied median lobe with extension into the bladder base. Stomach/Bowel: Signs of gastric antral thickening and generalized gastric thickening. Edema and stranding about the gastric antrum. Loss of normal rugal folds along the lesser curvature with broad-based appearance and a more focal protrusion along the posterior aspect of the stomach. The more central focal area measuring 9 mm. Loss of normal  gastric folds along approximally 50-70% of the circumference as viewed in the sagittal plane. Mucosal irregularity also tracks distally into the gastric antrum to towards the pylorus. No substantial duodenal thickening. No sign of small bowel obstruction. No pericolonic stranding.  The appendix is normal. Vascular/Lymphatic: Aortic atherosclerosis. No sign of aneurysm. Smooth contour of the IVC. There is no gastrohepatic or hepatoduodenal ligament lymphadenopathy. No retroperitoneal or mesenteric lymphadenopathy. No pelvic sidewall lymphadenopathy. Reproductive: Heterogeneous prostate. Suspected median lobe extension into bladder base though size of prostate and location in the trigone is nonspecific making it difficult to exclude a small bladder lesion. Other: No  pneumoperitoneum.  Trace free fluid in the pelvis. Musculoskeletal: No acute bone finding. No destructive bone process. Spinal degenerative changes. Degenerative changes are mild. IMPRESSION: 1. Signs of extensive g

## 2021-12-09 NOTE — H&P (Addendum)
?Date: 12/09/2021     ?     ?     ?Patient Name:  Wayne Figueroa MRN: [id number]  ?DOB: 1980-12-18 Age / Sex: 41 y.o., male   ?PCP: Patient, No Pcp Per (Inactive)    ?     ?Medical Service: Internal Medicine Teaching Service    ?     ?Attending Physician: Dr. Malvin Johns, MD    ?First Contact: Christiana Fuchs, DO Pager: KM [phone]  ?Second Contact: Sanjuana Letters, DO Pager: VK [phone]  ?     ?After Hours (After 5p/  First Contact Pager: [phone]  ?weekends / holidays): Second Contact Pager: [phone]  ? ?SUBJECTIVE  ? ?Chief Complaint: Left Upper Abdominal Pain ? ?History of Present Illness:  ?Wayne Figueroa is a 41 year old with no significant past medical history, who presented to the ED today with a complaint of left-sided abdominal pain.  He states that this started 4 days ago around 11 AM while at work.  He has never had this pain before.  He states that it is a sharp pain that squeezes and lights ago.  The episodes last about 40 minutes.  He takes Pepto bismol and Tums to help relieve symptoms.  He has never had this pain before.  Denies throwing up or feeling nauseated.  He does not constipated or have diarrhea, denies melena and hematochezia.  He initially was eating the first 2 days of the symptoms, however has not had an appetite due to pain.  States the pain does not worsen it but he just does not have an appetite.  He states that he takes Aleve 1-2 times a month for headaches. ? ?ED Course: ?In the ED patient was initially hypertensive to 142/100, with pulse of 44.  Lipase at 29, CMP unremarkable, white blood cell count at 7.7, hemoglobin of 15.1.  ? ?Meds:  ?1 Aleve PRN for headaches ? ?No PMHx ?No PSHx ? ?Social:  ?Lives With: Family ?Occupation: Software engineer ?Support: He lives with his wife and 2 children and grandchild ?Level of Function: Able to perform ADL/IADL's ?PCP: None ?Substances: Smoke 1PPD every 2 days since he was in 20's.  He drinks half a bottle of palmesome everyday, about  4 bottles weekly. ? ?Family History:  ?Denies FMHx of cancers ? ?Allergies: ?Allergies as of 12/09/2021  ? (No Known Allergies)  ? ? ?Review of Systems: ?A complete ROS was negative except as per HPI.  ? ?OBJECTIVE:  ? ?Physical Exam: ?Blood pressure 131/86, pulse 65, temperature 98.7 ?F (37.1 ?C), temperature source Oral, resp. rate 15, height 6\' 2"  (1.88 m), weight 77.1 kg, SpO2 100 %.  ?Physical Exam ?Constitutional:   ?   General: He is not in acute distress. ?   Appearance: He is well-developed and normal weight.  ?Cardiovascular:  ?   Rate and Rhythm: Normal rate and regular rhythm.  ?   Heart sounds: Normal heart sounds.  ?Pulmonary:  ?   Effort: Pulmonary effort is normal.  ?   Breath sounds: Normal breath sounds.  ?Abdominal:  ?   General: Abdomen is flat. Bowel sounds are normal. There is no distension.  ?   Palpations: Abdomen is soft.  ?   Tenderness: There is abdominal tenderness in the left upper quadrant.  ?Neurological:  ?   General: No focal deficit present.  ?   Mental Status: He is alert and oriented to person, place, and time.  ?  ? ?Labs: ?CBC ?   ?Component Value Date/Time  ?  WBC 7.7 12/09/2021 0855  ? RBC 4.14 (L) 12/09/2021 0855  ? HGB 15.1 12/09/2021 0855  ? HCT 44.1 12/09/2021 0855  ? PLT 343 12/09/2021 0855  ? MCV 106.5 (H) 12/09/2021 0855  ? MCH 36.5 (H) 12/09/2021 0855  ? MCHC 34.2 12/09/2021 0855  ? RDW 11.9 12/09/2021 0855  ?  ? ?CMP  ?   ?Component Value Date/Time  ? NA 137 12/09/2021 0855  ? K 3.7 12/09/2021 0855  ? CL 104 12/09/2021 0855  ? CO2 24 12/09/2021 0855  ? GLUCOSE 106 (H) 12/09/2021 0855  ? BUN 9 12/09/2021 0855  ? CREATININE 1.23 12/09/2021 0855  ? CALCIUM 9.4 12/09/2021 0855  ? PROT 6.1 (L) 12/09/2021 0855  ? ALBUMIN 3.5 12/09/2021 0855  ? AST 40 12/09/2021 0855  ? ALT 35 12/09/2021 0855  ? ALKPHOS 56 12/09/2021 0855  ? BILITOT 0.9 12/09/2021 0855  ? GFRNONAA >60 12/09/2021 0855  ? ? ?Imaging: ?CT ABDOMEN PELVIS W CONTRAST ? ?Result Date: 12/09/2021 ?CLINICAL DATA:   41 year old male presents for evaluation of LEFT upper quadrant abdominal pain. EXAM: CT ABDOMEN AND PELVIS WITH CONTRAST TECHNIQUE: Multidetector CT imaging of the abdomen and pelvis was performed using the standard protocol following bolus administration of intravenous contrast. RADIATION DOSE REDUCTION: This exam was performed according to the departmental dose-optimization program which includes automated exposure control, adjustment of the mA and/or kV according to patient size and/or use of iterative reconstruction technique. CONTRAST:  188mL OMNIPAQUE IOHEXOL 300 MG/ML  SOLN COMPARISON:  None FINDINGS: Lower chest: Incidental imaging of the lung bases is unremarkable. Hepatobiliary: No focal, suspicious hepatic lesion. No pericholecystic stranding. No biliary duct dilation. Portal vein is patent. Pancreas: Normal, without mass, inflammation or ductal dilatation. Spleen: Spleen is small but otherwise normal without focal lesion. Adrenals/Urinary Tract: Adrenal glands are unremarkable. Symmetric renal enhancement. No sign of hydronephrosis. No suspicious renal lesion or perinephric stranding. Urinary bladder with limited distension. Enhancing focus at the bladder base may arise from the prostate though there is mild prostate heterogeneity and enlargement less than would be expected for hypertrophied median lobe with extension into the bladder base. Stomach/Bowel: Signs of gastric antral thickening and generalized gastric thickening. Edema and stranding about the gastric antrum. Loss of normal rugal folds along the lesser curvature with broad-based appearance and a more focal protrusion along the posterior aspect of the stomach. The more central focal area measuring 9 mm. Loss of normal gastric folds along approximally 50-70% of the circumference as viewed in the sagittal plane. Mucosal irregularity also tracks distally into the gastric antrum to towards the pylorus. No substantial duodenal thickening. No sign  of small bowel obstruction. No pericolonic stranding.  The appendix is normal. Vascular/Lymphatic: Aortic atherosclerosis. No sign of aneurysm. Smooth contour of the IVC. There is no gastrohepatic or hepatoduodenal ligament lymphadenopathy. No retroperitoneal or mesenteric lymphadenopathy. No pelvic sidewall lymphadenopathy. Reproductive: Heterogeneous prostate. Suspected median lobe extension into bladder base though size of prostate and location in the trigone is nonspecific making it difficult to exclude a small bladder lesion. Other: No pneumoperitoneum.  Trace free fluid in the pelvis. Musculoskeletal: No acute bone finding. No destructive bone process. Spinal degenerative changes. Degenerative changes are mild. IMPRESSION: 1. Signs of extensive gastric antral inflammation and areas of focal ulceration without current secondary signs of perforation. Given severity of inflammation seen would suggest GI and or surgical consultation for further assessment/close follow-up. 2. Generalized thickening of the stomach again more likely reflects diffuse gastritis in the setting of  ulcer disease. Given degree of thickening endoscopic follow-up may be warranted to exclude underlying neoplasm. 3. Enhancing focus at the bladder base may arise from the prostate though there is mild prostate heterogeneity and enlargement less than would be expected for hypertrophied median lobe with extension into the bladder base. Urologic follow-up, correlation with urine studies and cystoscopy as warranted may be helpful as small urothelial lesion/neoplasm at the bladder base is not entirely excluded. Findings remain nonspecific on CT. 4. Trace free fluid in the pelvis, nonspecific. 5. Aortic atherosclerosis. Aortic Atherosclerosis (ICD10-I70.0). Electronically Signed   By: Zetta Bills M.D.   On: 12/09/2021 12:27   ? ? ? ?ASSESSMENT & PLAN:  ? ? ?Assessment & Plan by Problem: ?Active Problems: ?  * No active hospital problems.  * ? ? ?Wayne Figueroa is a 41 y.o. with no significant past medical history who presented with left upper quadrant pain and admitted for diffuse gastritis concerning for possible malignancy on hospital day 0 ?

## 2021-12-10 ENCOUNTER — Observation Stay (HOSPITAL_BASED_OUTPATIENT_CLINIC_OR_DEPARTMENT_OTHER): Payer: Self-pay | Admitting: Anesthesiology

## 2021-12-10 ENCOUNTER — Encounter (HOSPITAL_COMMUNITY)
Admission: EM | Disposition: A | Discharge status: Home / Self Care | Payer: Self-pay | Source: Home / Self Care | Attending: Emergency Medicine

## 2021-12-10 ENCOUNTER — Observation Stay (HOSPITAL_COMMUNITY): Payer: Self-pay | Admitting: Anesthesiology

## 2021-12-10 ENCOUNTER — Encounter (HOSPITAL_COMMUNITY): Payer: Self-pay | Admitting: Internal Medicine

## 2021-12-10 DIAGNOSIS — K3189 Other diseases of stomach and duodenum: Secondary | ICD-10-CM

## 2021-12-10 DIAGNOSIS — K259 Gastric ulcer, unspecified as acute or chronic, without hemorrhage or perforation: Secondary | ICD-10-CM

## 2021-12-10 DIAGNOSIS — K2960 Other gastritis without bleeding: Secondary | ICD-10-CM

## 2021-12-10 DIAGNOSIS — K279 Peptic ulcer, site unspecified, unspecified as acute or chronic, without hemorrhage or perforation: Secondary | ICD-10-CM

## 2021-12-10 HISTORY — PX: ESOPHAGOGASTRODUODENOSCOPY (EGD) WITH PROPOFOL: SHX5813

## 2021-12-10 HISTORY — PX: BIOPSY: SHX5522

## 2021-12-10 LAB — CBC
HCT: 38 % — ABNORMAL LOW (ref 39.0–52.0)
Hemoglobin: 13.1 g/dL (ref 13.0–17.0)
MCH: 36.8 pg — ABNORMAL HIGH (ref 26.0–34.0)
MCHC: 34.5 g/dL (ref 30.0–36.0)
MCV: 106.7 fL — ABNORMAL HIGH (ref 80.0–100.0)
Platelets: 280 10*3/uL (ref 150–400)
RBC: 3.56 MIL/uL — ABNORMAL LOW (ref 4.22–5.81)
RDW: 11.9 % (ref 11.5–15.5)
WBC: 5.6 10*3/uL (ref 4.0–10.5)
nRBC: 0 % (ref 0.0–0.2)

## 2021-12-10 LAB — COMPREHENSIVE METABOLIC PANEL
ALT: 26 U/L (ref 0–44)
AST: 25 U/L (ref 15–41)
Albumin: 3 g/dL — ABNORMAL LOW (ref 3.5–5.0)
Alkaline Phosphatase: 42 U/L (ref 38–126)
Anion gap: 6 (ref 5–15)
BUN: 8 mg/dL (ref 6–20)
CO2: 24 mmol/L (ref 22–32)
Calcium: 8.1 mg/dL — ABNORMAL LOW (ref 8.9–10.3)
Chloride: 108 mmol/L (ref 98–111)
Creatinine, Ser: 1.14 mg/dL (ref 0.61–1.24)
GFR, Estimated: >60 mL/min (ref >60)
Glucose, Bld: 91 mg/dL (ref 70–99)
Potassium: 3.5 mmol/L (ref 3.5–5.1)
Sodium: 138 mmol/L (ref 135–145)
Total Bilirubin: 0.7 mg/dL (ref 0.3–1.2)
Total Protein: 5.2 g/dL — ABNORMAL LOW (ref 6.5–8.1)

## 2021-12-10 SURGERY — ESOPHAGOGASTRODUODENOSCOPY (EGD) WITH PROPOFOL
Anesthesia: Monitor Anesthesia Care

## 2021-12-10 MED ORDER — LIDOCAINE 2% (20 MG/ML) 5 ML SYRINGE
INTRAMUSCULAR | Status: DC | PRN
Start: 2021-12-10 — End: 2021-12-10
  Administered 2021-12-10: 40 mg via INTRAVENOUS

## 2021-12-10 MED ORDER — PROPOFOL 10 MG/ML IV BOLUS
INTRAVENOUS | Status: DC | PRN
  Administered 2021-12-10: 25 mg via INTRAVENOUS
  Administered 2021-12-10: 35 mg via INTRAVENOUS

## 2021-12-10 MED ORDER — LACTATED RINGERS IV SOLN
INTRAVENOUS | Status: DC
Start: 2021-12-10 — End: 2021-12-10

## 2021-12-10 MED ORDER — DEXMEDETOMIDINE (PRECEDEX) IN NS 20 MCG/5ML (4 MCG/ML) IV SYRINGE
PREFILLED_SYRINGE | INTRAVENOUS | Status: DC | PRN
  Administered 2021-12-10 (×2): 8 ug via INTRAVENOUS

## 2021-12-10 MED ORDER — PROPOFOL 500 MG/50ML IV EMUL
INTRAVENOUS | Status: DC | PRN
  Administered 2021-12-10: 125 ug/kg/min via INTRAVENOUS

## 2021-12-10 MED ORDER — SODIUM CHLORIDE 0.9 % IV SOLN: INTRAVENOUS | Status: DC

## 2021-12-10 MED ORDER — PANTOPRAZOLE SODIUM 40 MG PO TBEC
40.0000 mg | DELAYED_RELEASE_TABLET | Freq: Two times a day (BID) | ORAL | 0 refills | Status: DC
Start: 2021-12-10 — End: 2022-01-06

## 2021-12-10 SURGICAL SUPPLY — 15 items

## 2021-12-10 NOTE — Transfer of Care (Signed)
Immediate Anesthesia Transfer of Care Note ? ?Patient: Wayne Figueroa ? ?Procedure(s) Performed: ESOPHAGOGASTRODUODENOSCOPY (EGD) WITH PROPOFOL ?BIOPSY ? ?Patient Location: PACU ? ?Anesthesia Type:MAC ? ?Level of Consciousness: awake, alert  and oriented ? ?Airway & Oxygen Therapy: Patient Spontanous Breathing ? ?Post-op Assessment: Report given to RN, Post -op Vital signs reviewed and stable and Patient moving all extremities X 4 ? ?Post vital signs: Reviewed and stable ? ?Last Vitals:  ?Vitals Value Taken Time  ?BP 106/71 12/10/21 1057  ?Temp    ?Pulse 66 12/10/21 1057  ?Resp 15 12/10/21 1057  ?SpO2 100 % 12/10/21 1057  ?Vitals shown include unvalidated device data. ? ?Last Pain:  ?Vitals:  ? 12/10/21 1001  ?TempSrc: Temporal  ?PainSc: 0-No pain  ?   ? ?  ? ?Complications: No notable events documented. ?

## 2021-12-10 NOTE — Interval H&P Note (Signed)
History and Physical Interval Note: ? ?12/10/2021 ?10:52 AM ? ?Wayne Figueroa  has presented today for surgery, with the diagnosis of Abnormal Ct stomach.  The various methods of treatment have been discussed with the patient and family. After consideration of risks, benefits and other options for treatment, the patient has consented to  Procedure(s): ?ESOPHAGOGASTRODUODENOSCOPY (EGD) WITH PROPOFOL (N/A) ?BIOPSY as a surgical intervention.  The patient's history has been reviewed, patient examined, no change in status, stable for surgery.  I have reviewed the patient's chart and labs.  Questions were answered to the patient's satisfaction.   ? ? ?Olson Lucarelli ? ? ?

## 2021-12-10 NOTE — Discharge Summary (Signed)
? ?Name: Wayne Figueroa ?MRN: [id number] ?DOB: 10/19/1980 41 y.o. ?PCP: Patient, No Pcp Per (Inactive) ? ?Date of Admission: 12/09/2021  8:36 AM ?Date of Discharge: 12/10/21 ?Attending Physician: Wayne LaLau, Grace, MD ? ?Discharge Diagnosis: ?1.  Diffuse gastritis ?2.  Abnormal prostate ?3.  Aortic atherosclerosis ? ?Discharge Medications: ?Allergies as of 12/10/2021   ?No Known Allergies ?  ? ?  ?Medication List  ?  ? ?STOP taking these medications   ? ?doxycycline 100 MG capsule ?Commonly known as: VIBRAMYCIN ?  ? ?  ? ?TAKE these medications   ? ?pantoprazole 40 MG tablet ?Commonly known as: Protonix ?Take 1 tablet (40 mg total) by mouth 2 (two) times daily. ?  ? ?  ? ? ?Disposition and follow-up:   ?Mr.Wayne Figueroa was [facility] in St[facility].  At the hospital follow up visit please address: ? ?Diffuse gastritis ?Started on protonix 40 mg BID, he will need to follow-up with [facility] within 2 weeks to review biopsy results. He should not drink alcohol or consume NSAIDs. ? ?Enlarged prostate ?Referral to urology ? ?Aortic atherosclerosis ?Recommended lipid panel to screen for primary prevention. ? ?Tobacco cessation ? ?Labs / imaging needed at time of follow-up: CBC, lipid panel ? ?Pending labs/ test needing follow-up: pathology from endoscopy ? ?Follow-up Appointments: ?Follow-up with [facility] within 2 weeks to discuss biopsy results.  Patient was given phone number of office but due to holiday was not able to schedule appointments prior to discharge. ? ?Hospital Course by problem list: ?Diffuse gastritis ?Patient presented with 4 days of left upper quadrant pain.  He described it as stabbing pain that lasted for about 40 minutes.  He was taking Tums and Pepto-Bismol to relieve pain.  He denied hematochezia or melena. He does have a significant alcohol use history.  He drinks about 4 bottles of palmesome(?) per week.  He is also a current smoker and takes NSAIDs on  occasion for headaches.  Initial work-up was negative for pancreatitis, hemoglobin at 15, and no leukocytosis.  CT abdomen pelvis showed extensive gastric antral inflammation and in areas of focal ulceration without current secondary signs of perforation.  It also showed generalized thickening of the stomach reflective of diffuse gastritis, but unable to exclude neoplasm.  GI was consulted and patient underwent EGD on 4/7.  EGD showed diffuse prominent gastric folds with few erosions in the gastric antrum.  No ulcers or masses noted.  Biopsies were taken.  Patient was instructed not to take NSAIDs or consume alcohol.  He was started on Protonix 40 mg twice daily.  He will need to follow-up with  GI to review pathology results within 1 to 2 weeks. ? ?Abnormal prostate ?CT showed concern focus of the bladder base thought to arise from the prostate. Urologic follow-up was recommendation. ? ?Tobacco use disorder ?Patient smokes 1 pack/day every 2 days since he was in his 2320s. ? ?Aortic atherosclerosis ?Present on CT. ? ?Discharge Exam:   ?BP 122/78 (BP Location: Right Arm)   Pulse 60   Temp (!) 97.4 ?F (36.3 ?C)   Resp 15   Ht 6\' 2"  (1.88 m)   Wt 77.1 kg   SpO2 99%   BMI 21.83 kg/m?  ?Discharge exam:  ?Physical Exam ?Constitutional:   ?   Appearance: He is well-developed and normal weight.  ?HENT:  ?   Mouth/Throat:  ?   Mouth: Mucous membranes are moist.  ?   Pharynx: Oropharynx is  clear.  ?Cardiovascular:  ?   Rate and Rhythm: Normal rate and regular rhythm.  ?   Heart sounds: Normal heart sounds.  ?Pulmonary:  ?   Effort: Pulmonary effort is normal.  ?   Breath sounds: Normal breath sounds.  ?Abdominal:  ?   General: Abdomen is flat. Bowel sounds are normal. There is no distension.  ?Skin: ?   General: Skin is warm and dry.  ?Neurological:  ?   General: No focal deficit present.  ?   Mental Status: He is alert and oriented to person, place, and time.  ?  ? ?Pertinent Labs, Studies, and Procedures:  ? ?   Latest Ref Rng & Units 12/10/2021  ?  6:31 AM 12/09/2021  ?  8:55 AM  ?CBC  ?WBC 4.0 - 10.5 K/uL 5.6   7.7    ?Hemoglobin 13.0 - 17.0 g/dL 36.6   29.4    ?Hematocrit 39.0 - 52.0 % 38.0   44.1    ?Platelets 150 - 400 K/uL 280   343    ?  ? ?  Latest Ref Rng & Units 12/10/2021  ?  6:31 AM 12/09/2021  ?  8:55 AM  ?BMP  ?Glucose 70 - 99 mg/dL 91   765    ?BUN 6 - 20 mg/dL 8   9    ?Creatinine 0.61 - 1.24 mg/dL 4.65   0.35    ?Sodium 135 - 145 mmol/L 138   137    ?Potassium 3.5 - 5.1 mmol/L 3.5   3.7    ?Chloride 98 - 111 mmol/L 108   104    ?CO2 22 - 32 mmol/L 24   24    ?Calcium 8.9 - 10.3 mg/dL 8.1   9.4    ?  ?CT Abd pelvis 12/09/21: ?IMPRESSION: ?1. Signs of extensive gastric antral inflammation and areas of focal ?ulceration without current secondary signs of perforation. Given ?severity of inflammation seen would suggest GI and or surgical ?consultation for further assessment/close follow-up. ?2. Generalized thickening of the stomach again more likely reflects ?diffuse gastritis in the setting of ulcer disease. Given degree of ?thickening endoscopic follow-up may be warranted to exclude ?underlying neoplasm. ?3. Enhancing focus at the bladder base may arise from the prostate ?though there is mild prostate heterogeneity and enlargement less ?than would be expected for hypertrophied median lobe with extension ?into the bladder base. Urologic follow-up, correlation with urine ?studies and cystoscopy as warranted may be helpful as small ?urothelial lesion/neoplasm at the bladder base is not entirely ?excluded. Findings remain nonspecific on CT. ?4. Trace free fluid in the pelvis, nonspecific. ?5. Aortic atherosclerosis. ?  ?Aortic Atherosclerosis (ICD10-I70.0). ? ?Discharge Instructions: ?Discharge Instructions   ? ? Diet - low sodium heart healthy   Complete by: As directed ?  ? Discharge instructions   Complete by: As directed ?  ? [name], ? ?You were recently admitted to [facility] for stomach pain.  GI did a  procedure to look into your esophagus and stomach.  No signs of ulcers, there was some inflammation there.  They took some samples of those areas and you need to follow-up with them in 2 weeks to go over results.  Please call their number at [phone] to set up a follow-up appointment with them.  ? ?Start taking  ?1.  Protonix 40 mg, 1 time in the morning and 1 time in the evening ? ?I recommend that you establish with a primary care physician.  I  sent a message to [facility] internal medicine clinic and they will reach out to you next week to schedule a hospital follow-up appointment. ? ?Sincerely, ?Rudene Christians, DO  ? Increase activity slowly   Complete by: As directed ?  ? ?  ? ? ?Signed: ?Rudene Christians, DO ?12/10/2021, 12:59 PM   ?Pager: [phone] ?

## 2021-12-10 NOTE — Anesthesia Postprocedure Evaluation (Signed)
Anesthesia Post Note ? ?Patient: [name] ? ?Procedure(s) Performed: ESOPHAGOGASTRODUODENOSCOPY (EGD) WITH PROPOFOL ?BIOPSY ? ?  ? ?Patient location during evaluation: Endoscopy ?Anesthesia Type: MAC ?Level of consciousness: awake and alert ?Pain management: pain level controlled ?Vital Signs Assessment: post-procedure vital signs reviewed and stable ?Respiratory status: spontaneous breathing, nonlabored ventilation, respiratory function stable and patient connected to nasal cannula oxygen ?Cardiovascular status: stable and blood pressure returned to baseline ?Postop Assessment: no apparent nausea or vomiting ?Anesthetic complications: no ? ? ?No notable events documented. ? ?Last Vitals:  ?Vitals:  ? 12/10/21 1057 12/10/21 1115  ?BP: 106/71 122/78  ?Pulse: 66 60  ?Resp: 15 15  ?Temp: 36.7 ?C (!) 36.3 ?C  ?SpO2: 100% 99%  ?  ?Last Pain:  ?Vitals:  ? 12/10/21 1115  ?TempSrc:   ?PainSc: 0-No pain  ? ? ?  ?  ?  ?  ?  ?  ? ?Tahjae Clausing DANIEL ? ? ? ? ?

## 2021-12-10 NOTE — Anesthesia Preprocedure Evaluation (Addendum)
Anesthesia Evaluation  ?Patient identified by MRN, date of birth, ID band ?Patient awake ? ? ? ?Reviewed: ?Allergy & Precautions, NPO status , Patient's Chart, lab work & pertinent test results ? ?History of Anesthesia Complications ?Negative for: history of anesthetic complications ? ?Airway ?Mallampati: II ? ?TM Distance: >3 FB ?Neck ROM: Full ? ? ? Dental ? ?(+) Dental Advisory Given, Poor Dentition, Chipped, Missing ?  ?Pulmonary ?Current SmokerPatient did not abstain from smoking.,  ?  ?Pulmonary exam normal ? ? ? ? ? ? ? Cardiovascular ?negative cardio ROS ?Normal cardiovascular exam ? ? ?  ?Neuro/Psych ?negative neurological ROS ?   ? GI/Hepatic ?Neg liver ROS, PUD,   ?Endo/Other  ?negative endocrine ROS ? Renal/GU ?negative Renal ROS  ? ?  ?Musculoskeletal ?negative musculoskeletal ROS ?(+)  ? Abdominal ?  ?Peds ? Hematology ?negative hematology ROS ?(+)   ?Anesthesia Other Findings ? ? Reproductive/Obstetrics ? ?  ? ? ? ? ? ? ? ? ? ? ? ? ? ?  ?  ? ? ? ? ? ? ?Anesthesia Physical ?Anesthesia Plan ? ?ASA: 2 ? ?Anesthesia Plan: MAC  ? ?Post-op Pain Management: Minimal or no pain anticipated  ? ?Induction:  ? ?PONV Risk Score and Plan: Propofol infusion ? ?Airway Management Planned: Natural Airway ? ?Additional Equipment:  ? ?Intra-op Plan:  ? ?Post-operative Plan:  ? ?Informed Consent: I have reviewed the patients History and Physical, chart, labs and discussed the procedure including the risks, benefits and alternatives for the proposed anesthesia with the patient or authorized representative who has indicated his/her understanding and acceptance.  ? ? ? ?Dental advisory given ? ?Plan Discussed with: Anesthesiologist and CRNA ? ?Anesthesia Plan Comments:   ? ? ? ? ? ?Anesthesia Quick Evaluation ? ?

## 2021-12-10 NOTE — Op Note (Signed)
[facility] ?Patient Name: Wayne Figueroa ?Procedure Date : 12/10/2021 ?MRN: [id number] ?Attending MD: Juanita Craver , MD ?Date of Birth: October 10, 1980 ?CSN: [id number] ?Age: 41 ?Admit Type: Outpatient ?Procedure:                EGD with biopsies ?Indications:              Abdominal pain in the left upper quadrant, Abnormal  ?                          CT of the GI tract ?Providers:                Juanita Craver, MD, Carmie End, RN, Alphonzo Grieve  ?                          Leighton Roach, Technician, Junie Bame, CRNA, Jeneen Rinks  ?                          Tobias Alexander, MD ?Referring MD:             No PCP. ?Medicines:                Monitored Anesthesia Care ?Complications:            No immediate complications. ?Estimated Blood Loss:     Estimated blood loss: minimal. ?Procedure:                Pre-Anesthesia Assessment: - Prior to the  ?                          procedure, a history and physical was performed,  ?                          and patient medications and allergies were  ?                          reviewed. The patient's tolerance of previous  ?                          anesthesia was also reviewed. The risks and  ?                          benefits of the procedure and the sedation options  ?                          and risks were discussed with the patient. All  ?                          questions were answered, and informed consent was  ?                          obtained. Prior Anticoagulants: The patient has  ?                          taken no previous anticoagulant or antiplatelet  ?  agents. ASA Grade Assessment: II - A patient with  ?                          mild systemic disease. After reviewing the risks  ?                          and benefits, the patient was deemed in  ?                          satisfactory condition to undergo the procedure.  ?                          After obtaining informed consent, the endoscope was  ?                          passed under direct  vision. Throughout the  ?                          procedure, the patient's blood pressure, pulse, and  ?                          oxygen saturations were monitored continuously. The  ?                          [id number]-[id number] ([id number]) Olympus endoscope was introduced  ?                          through the mouth, and advanced to the second part  ?                          of duodenum. The EGD was accomplished without  ?                          difficulty. The patient tolerated the procedure  ?                          well. ?Scope In: ?Scope Out: ?Findings: ?     The examined esophagus and GEJ appeared widely patent and normal. ?     Diffuse prominent gastric folds were found in the gastric body-biopsies  ?     were taken with a cold forceps for histology. ?     A few erosions were found in the gastric antrum; no ulcers or masses  ?     noted; no abnormalities not on retroflexion.. ?     Patchy mildly erythematous mucosa without stigmata of bleeding was found  ?     in the duodenal bulb. ?     The post-bulbar duodenum was normal. ?Impression:               - Normal apppearing, widely patent esophagus and  ?                          GEJ. ?                          -  Prominent gastric folds in the gastric  ?                          body-biopsied; NO ulcers or masses noted. ?                          - Erythematous duodenopathy in the duodenal bulb. ?                          - Normal post-bulbar duodenum. ?Moderate Sedation: ?     MAC used. ?Recommendation:           - Resume regular diet today. ?                          - Continue present medications-change to PO PPI's. ?                          - Await pathology results. ?                          - AVOID THE USE OF ALL NSAIDS & ALCOHOL ?                          - Return to [place] office in 2 weeks. ?Procedure Code(s):        --- Professional --- ?                          [phone], Esophagogastroduodenoscopy, flexible,  ?                          transoral; with  biopsy, single or multiple ?Diagnosis Code(s):        --- Professional --- ?                          R10.12, Left upper quadrant pain ?                          R93.3, Abnormal findings on diagnostic imaging of  ?                          other parts of digestive tract ?                          K29.60, Other gastritis without bleeding ?                          K31.89, Other diseases of stomach and duodenum ?CPT copyright 2019 [employer]. All rights reserved. ?The codes documented in this report are preliminary and upon coder review may  ?be revised to meet current compliance requirements. ?Juanita Craver, MD ?Juanita Craver, MD ?12/10/2021 11:02:45 AM ?This report has been signed electronically. ?Number of Addenda: 0 ?

## 2021-12-10 NOTE — Anesthesia Procedure Notes (Addendum)
Procedure Name: [place] ?Date/Time: 12/10/2021 10:39 AM ?Performed by: Rande Brunt, CRNA ?Pre-anesthesia Checklist: Patient identified, Emergency Drugs available, Suction available and Patient being monitored ?Patient Re-evaluated:Patient Re-evaluated prior to induction ?Oxygen Delivery Method: Nasal cannula ?Preoxygenation: Pre-oxygenation with 100% oxygen ?Induction Type: IV induction ?Airway Equipment and Method: Bite block ?Placement Confirmation: positive ETCO2 and CO2 detector ?Dental Injury: Teeth and Oropharynx as per pre-operative assessment  ? ? ? ? ?

## 2021-12-14 LAB — SURGICAL PATHOLOGY

## 2021-12-23 ENCOUNTER — Encounter: Payer: Medicaid Other | Admitting: Internal Medicine

## 2022-01-06 ENCOUNTER — Other Ambulatory Visit: Payer: Self-pay | Admitting: Internal Medicine

## 2022-01-06 NOTE — Telephone Encounter (Signed)
Talked with patient about need to be seen in [facility] after discharge form hospital. I will fill Rx for one month until he is able to be seen in clinic. ?

## 2022-01-26 ENCOUNTER — Ambulatory Visit: Payer: Medicaid Other | Admitting: Internal Medicine
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
